# Patient Record
Sex: Female | Born: 1935 | Race: White | Hispanic: No | Smoking: Never smoker
Health system: Southern US, Community
[De-identification: ages and names within clinical notes are randomized; demographics above are authoritative.]

## PROBLEM LIST (undated history)

## (undated) DIAGNOSIS — F0390 Unspecified dementia without behavioral disturbance: Secondary | ICD-10-CM

## (undated) DIAGNOSIS — I10 Essential (primary) hypertension: Secondary | ICD-10-CM

## (undated) DIAGNOSIS — J449 Chronic obstructive pulmonary disease, unspecified: Secondary | ICD-10-CM

## (undated) DIAGNOSIS — G309 Alzheimer's disease, unspecified: Secondary | ICD-10-CM

## (undated) DIAGNOSIS — F0280 Dementia in other diseases classified elsewhere without behavioral disturbance: Secondary | ICD-10-CM

## (undated) DIAGNOSIS — M1990 Unspecified osteoarthritis, unspecified site: Secondary | ICD-10-CM

## (undated) DIAGNOSIS — H9190 Unspecified hearing loss, unspecified ear: Secondary | ICD-10-CM

## (undated) DIAGNOSIS — R3915 Urgency of urination: Secondary | ICD-10-CM

## (undated) HISTORY — PX: CHOLECYSTECTOMY: SHX55

## (undated) HISTORY — PX: ABDOMINAL HYSTERECTOMY: SHX81

---

## 2008-01-29 ENCOUNTER — Emergency Department (HOSPITAL_COMMUNITY): Admission: EM | Admit: 2008-01-29 | Discharge: 2008-01-29 | Payer: Self-pay | Admitting: Emergency Medicine

## 2008-10-24 ENCOUNTER — Inpatient Hospital Stay (HOSPITAL_COMMUNITY): Admission: EM | Admit: 2008-10-24 | Discharge: 2008-10-30 | Payer: Self-pay | Admitting: Emergency Medicine

## 2008-10-25 ENCOUNTER — Ambulatory Visit: Payer: Self-pay | Admitting: Vascular Surgery

## 2008-10-25 ENCOUNTER — Encounter (INDEPENDENT_AMBULATORY_CARE_PROVIDER_SITE_OTHER): Payer: Self-pay | Admitting: Orthopaedic Surgery

## 2008-10-26 ENCOUNTER — Ambulatory Visit: Payer: Self-pay | Admitting: Physical Medicine & Rehabilitation

## 2009-03-26 ENCOUNTER — Encounter: Admission: RE | Admit: 2009-03-26 | Discharge: 2009-06-19 | Payer: Self-pay | Admitting: Orthopaedic Surgery

## 2010-02-25 ENCOUNTER — Emergency Department (HOSPITAL_COMMUNITY): Admission: EM | Admit: 2010-02-25 | Discharge: 2010-02-25 | Payer: Self-pay | Admitting: Emergency Medicine

## 2010-09-08 ENCOUNTER — Ambulatory Visit: Payer: Self-pay | Admitting: Gynecology

## 2011-01-06 LAB — URINE CULTURE: Colony Count: NO GROWTH

## 2011-01-06 LAB — CBC
HCT: 34.5 % — ABNORMAL LOW (ref 36.0–46.0)
MCHC: 34.5 g/dL (ref 30.0–36.0)
Platelets: 112 10*3/uL — ABNORMAL LOW (ref 150–400)
RBC: 3.6 MIL/uL — ABNORMAL LOW (ref 3.87–5.11)
RDW: 13 % (ref 11.5–15.5)

## 2011-01-06 LAB — DIFFERENTIAL
Eosinophils Relative: 3 % (ref 0–5)
Monocytes Absolute: 0.9 10*3/uL (ref 0.1–1.0)
Monocytes Relative: 10 % (ref 3–12)
Neutro Abs: 5.9 10*3/uL (ref 1.7–7.7)

## 2011-01-06 LAB — COMPREHENSIVE METABOLIC PANEL
ALT: 15 U/L (ref 0–35)
AST: 26 U/L (ref 0–37)
Albumin: 3.1 g/dL — ABNORMAL LOW (ref 3.5–5.2)
Alkaline Phosphatase: 52 U/L (ref 39–117)
BUN: 14 mg/dL (ref 6–23)
CO2: 32 mEq/L (ref 19–32)
Chloride: 106 mEq/L (ref 96–112)
Creatinine, Ser: 0.86 mg/dL (ref 0.4–1.2)
GFR calc Af Amer: >60 mL/min (ref >60)
Glucose, Bld: 103 mg/dL — ABNORMAL HIGH (ref 70–99)
Potassium: 3.8 mEq/L (ref 3.5–5.1)
Sodium: 141 mEq/L (ref 135–145)

## 2011-01-06 LAB — URINALYSIS, ROUTINE W REFLEX MICROSCOPIC
Bilirubin Urine: NEGATIVE
Glucose, UA: NEGATIVE mg/dL
Ketones, ur: NEGATIVE mg/dL
Nitrite: NEGATIVE

## 2011-01-06 LAB — TROPONIN I: Troponin I: 0.01 ng/mL (ref 0.00–0.06)

## 2011-01-06 LAB — URINE MICROSCOPIC-ADD ON

## 2011-01-06 LAB — PROTIME-INR: INR: 0.99 (ref 0.00–1.49)

## 2011-02-02 LAB — CBC
HCT: 26.9 % — ABNORMAL LOW (ref 36.0–46.0)
HCT: 30.9 % — ABNORMAL LOW (ref 36.0–46.0)
HCT: 31.4 % — ABNORMAL LOW (ref 36.0–46.0)
HCT: 35.9 % — ABNORMAL LOW (ref 36.0–46.0)
Hemoglobin: 10.6 g/dL — ABNORMAL LOW (ref 12.0–15.0)
Hemoglobin: 10.8 g/dL — ABNORMAL LOW (ref 12.0–15.0)
Hemoglobin: 12 g/dL (ref 12.0–15.0)
Hemoglobin: 9.2 g/dL — ABNORMAL LOW (ref 12.0–15.0)
MCHC: 34.2 g/dL (ref 30.0–36.0)
MCHC: 34.4 g/dL (ref 30.0–36.0)
MCV: 92.1 fL (ref 78.0–100.0)
MCV: 92.9 fL (ref 78.0–100.0)
MCV: 93.6 fL (ref 78.0–100.0)
MCV: 93.7 fL (ref 78.0–100.0)
Platelets: 191 10*3/uL (ref 150–400)
Platelets: 200 10*3/uL (ref 150–400)
Platelets: 208 10*3/uL (ref 150–400)
Platelets: 226 10*3/uL (ref 150–400)
Platelets: 230 10*3/uL (ref 150–400)
RBC: 2.56 MIL/uL — ABNORMAL LOW (ref 3.87–5.11)
RBC: 2.88 MIL/uL — ABNORMAL LOW (ref 3.87–5.11)
RBC: 3.29 MIL/uL — ABNORMAL LOW (ref 3.87–5.11)
RBC: 3.83 MIL/uL — ABNORMAL LOW (ref 3.87–5.11)
RDW: 13.8 % (ref 11.5–15.5)
RDW: 14.1 % (ref 11.5–15.5)
RDW: 14.1 % (ref 11.5–15.5)
WBC: 10.1 10*3/uL (ref 4.0–10.5)
WBC: 11.1 10*3/uL — ABNORMAL HIGH (ref 4.0–10.5)
WBC: 9.6 10*3/uL (ref 4.0–10.5)

## 2011-02-02 LAB — BASIC METABOLIC PANEL
BUN: 13 mg/dL (ref 6–23)
Chloride: 104 mEq/L (ref 96–112)
GFR calc non Af Amer: >60 mL/min (ref >60)
Potassium: 3.6 mEq/L (ref 3.5–5.1)
Sodium: 136 mEq/L (ref 135–145)

## 2011-02-02 LAB — CULTURE, BLOOD (ROUTINE X 2)

## 2011-02-02 LAB — GLUCOSE, CAPILLARY
Glucose-Capillary: 119 mg/dL — ABNORMAL HIGH (ref 70–99)
Glucose-Capillary: 125 mg/dL — ABNORMAL HIGH (ref 70–99)
Glucose-Capillary: 127 mg/dL — ABNORMAL HIGH (ref 70–99)
Glucose-Capillary: 128 mg/dL — ABNORMAL HIGH (ref 70–99)
Glucose-Capillary: 129 mg/dL — ABNORMAL HIGH (ref 70–99)
Glucose-Capillary: 131 mg/dL — ABNORMAL HIGH (ref 70–99)
Glucose-Capillary: 132 mg/dL — ABNORMAL HIGH (ref 70–99)
Glucose-Capillary: 135 mg/dL — ABNORMAL HIGH (ref 70–99)
Glucose-Capillary: 148 mg/dL — ABNORMAL HIGH (ref 70–99)
Glucose-Capillary: 151 mg/dL — ABNORMAL HIGH (ref 70–99)
Glucose-Capillary: 160 mg/dL — ABNORMAL HIGH (ref 70–99)
Glucose-Capillary: 167 mg/dL — ABNORMAL HIGH (ref 70–99)
Glucose-Capillary: 170 mg/dL — ABNORMAL HIGH (ref 70–99)
Glucose-Capillary: 173 mg/dL — ABNORMAL HIGH (ref 70–99)
Glucose-Capillary: 181 mg/dL — ABNORMAL HIGH (ref 70–99)

## 2011-02-02 LAB — PROTIME-INR
INR: 1 (ref 0.00–1.49)
INR: 1 (ref 0.00–1.49)
INR: 1.1 (ref 0.00–1.49)
INR: 1.5 (ref 0.00–1.49)
INR: 1.6 — ABNORMAL HIGH (ref 0.00–1.49)
Prothrombin Time: 13.6 seconds (ref 11.6–15.2)
Prothrombin Time: 14.9 seconds (ref 11.6–15.2)
Prothrombin Time: 18.7 seconds — ABNORMAL HIGH (ref 11.6–15.2)

## 2011-02-02 LAB — URINALYSIS, ROUTINE W REFLEX MICROSCOPIC
Bilirubin Urine: NEGATIVE
Glucose, UA: >1000 mg/dL — AB
Hgb urine dipstick: NEGATIVE
Leukocytes, UA: NEGATIVE
Nitrite: NEGATIVE
Specific Gravity, Urine: 1.02 (ref 1.005–1.030)
Urobilinogen, UA: 0.2 mg/dL (ref 0.0–1.0)

## 2011-02-02 LAB — TYPE AND SCREEN
ABO/RH(D): A POS
Antibody Screen: NEGATIVE

## 2011-02-02 LAB — ABO/RH: ABO/RH(D): A POS

## 2011-02-02 LAB — COMPREHENSIVE METABOLIC PANEL
AST: 25 U/L (ref 0–37)
Albumin: 3.2 g/dL — ABNORMAL LOW (ref 3.5–5.2)
Alkaline Phosphatase: 50 U/L (ref 39–117)
BUN: 11 mg/dL (ref 6–23)
Calcium: 8.9 mg/dL (ref 8.4–10.5)
Creatinine, Ser: 0.74 mg/dL (ref 0.4–1.2)
GFR calc Af Amer: >60 mL/min (ref >60)
Glucose, Bld: 138 mg/dL — ABNORMAL HIGH (ref 70–99)
Sodium: 141 mEq/L (ref 135–145)
Total Protein: 4.9 g/dL — ABNORMAL LOW (ref 6.0–8.3)
Total Protein: 5.8 g/dL — ABNORMAL LOW (ref 6.0–8.3)

## 2011-02-02 LAB — DIFFERENTIAL
Eosinophils Relative: 3 % (ref 0–5)
Lymphocytes Relative: 8 % — ABNORMAL LOW (ref 12–46)
Monocytes Relative: 14 % — ABNORMAL HIGH (ref 3–12)
Neutro Abs: 6.6 10*3/uL (ref 1.7–7.7)
Neutrophils Relative %: 75 % (ref 43–77)

## 2011-02-02 LAB — CK TOTAL AND CKMB (NOT AT ARMC)
CK, MB: 2 ng/mL (ref 0.3–4.0)
Relative Index: INVALID (ref 0.0–2.5)
Total CK: 93 U/L (ref 7–177)

## 2011-02-02 LAB — URINE MICROSCOPIC-ADD ON

## 2011-02-02 LAB — LIPID PANEL
Cholesterol: 161 mg/dL (ref 0–200)
LDL Cholesterol: 110 mg/dL — ABNORMAL HIGH (ref 0–99)
Total CHOL/HDL Ratio: 4.7 RATIO

## 2011-02-02 LAB — HEMOGLOBIN A1C: Hgb A1c MFr Bld: 5.9 % (ref 4.6–6.1)

## 2011-02-02 LAB — D-DIMER, QUANTITATIVE: D-Dimer, Quant: 1.12 ug/mL-FEU — ABNORMAL HIGH (ref 0.00–0.48)

## 2011-02-02 LAB — CARDIAC PANEL(CRET KIN+CKTOT+MB+TROPI): CK, MB: 1.9 ng/mL (ref 0.3–4.0)

## 2011-02-02 LAB — APTT: aPTT: 32 seconds (ref 24–37)

## 2011-02-02 LAB — PREPARE RBC (CROSSMATCH)

## 2011-02-02 LAB — URINE CULTURE: Colony Count: NO GROWTH

## 2011-03-03 NOTE — Discharge Summary (Signed)
NAME:  Hannah Spears, Hannah Spears               ACCOUNT NO.:  [id number]   MEDICAL RECORD NO.:  [id number]          PATIENT TYPE:  INP   LOCATION:  1528                         FACILITY:  [facility]   PHYSICIAN:  Vanita Panda. Magnus Ivan, M.D.DATE OF BIRTH:  1919-12-23   DATE OF ADMISSION:  10/24/2008  DATE OF DISCHARGE:  10/30/2008                               DISCHARGE SUMMARY   ADMITTING DIAGNOSIS:  Left hip femoral neck fracture.   SECONDARY DIAGNOSES:  1. Hypertension.  2. Type 2 diabetes.  3. Arthritis.  4. Chronic obstructive pulmonary disease.   DISCHARGE DIAGNOSIS:  Status post left hip hemiarthroplasty secondary to  left hip femoral neck fracture.   SECONDARY DISCHARGE DIAGNOSES:  1. Hypertension.  2. Type 2 diabetes.  3. Arthritis.  4. Chronic obstructive pulmonary disease.   HOSPITAL COURSE:  Briefly, Hannah Spears is an 77 year old female who lives  alone with family nearby and is a [employer] information [employer] manager.  She fell on  October 15, 2008, which was 9 days prior to admission.  She developed  worsening left hip pain to the point that she had difficulty ambulating.  She was brought by her family to the [facility] Emergency Room and  found to have a left hip displaced femoral neck fracture.  The  orthopedic surgery service and medicine service were consulted.  She had  the potential for syncopal episodes and so studies were performed per  recommendation of the general medical service before clearance for  surgery.  This included an echocardiogram and carotid studies.  She also  had a CT angiogram to rule out a pulmonary embolus.  She was taken to  the operating room on October 25, 2008 after receiving medical clearance  for surgery.  At that point she underwent a left hip hemiarthroplasty  which was an uncomplicated surgery.  For a detailed description of the  operation, please refer to the dictated operative note in the patient's  medical record.  Postoperatively, she convalesced on a  regular hospital  floor and began working slowly with physical therapy and occupational  therapy.  She still needed quite a bit of assistance.  During her  hospitalization she did have acute blood loss anemia and required  transfusion of 2 units of packed red blood cells from which she did  well.  By the day of discharge it was felt that she needed further  convalescence in a skilled nursing facility to rehabilitate her  successfully for hopeful return to the community setting, given her  level of independent function prior to this.  By day of discharge she  was afebrile with stable vital signs.  Her incision was clean, dry, and  intact, and follow-up x-rays showed a well-seated implant.   DISPOSITION:  To skilled nursing facility.   DISCHARGE MEDICATIONS:  1. Nabumetone 500 mg p.o. b.i.d.  2. Evista 60 mg p.o. daily.  3. Terbutaline 5 mg p.o. b.i.d.  4. Potassium chloride 20 mEq p.o. daily.  5. Lasix 40 mg p.o. daily  6. Micardis 40 mg p.o. daily  7. Atrovent at 0.5 mg per 2.5 mL nebulizer q.6 h p.r.n.  8. Zocor 20 mg p.o. daily at 1800 hours  9. Protonix 40 mg p.o. daily  10.Multivitamin p.o. daily  11.Risperdal 0.25 mg p.o. q.h.s.  12.Oxycodone 5 mg p.o. q. 3-4 hours p.r.n.  13.Vicodin 5/500 one to two p.o. q. 4-6 hours p.r.n. pain.  14.Robaxin 500 mg p.o. q. 6 hours p.r.n.   DISCHARGE INSTRUCTIONS:  While at the skilled nursing facility efforts  should be made to ambulate Hannah Spears as much as possible.  Her incision  can get wet in the shower and dry dressing can be applied daily.  She  will work with physical therapy and occupational therapy to hopefully  regain mobility.  A follow-up appointment should be established at  [facility] with Dr. Allie Bossier in 1 week and should be  established with her primary care physician as soon as possible in the  next 1-2 weeks due to diabetes, previously untreated.   *Of note, while she is at skilled nursing facility, she  should continue  on Coumadin dosed daily at 1800 hours for a target INR of 1.9-2.5.  First dose of Coumadin tonight at the skilled nursing facility will be  for 3 mg, with adjustments made accordingly.      Vanita Panda. Magnus Ivan, M.D.  Electronically Signed     CYB/MEDQ  D:  10/30/2008  T:  10/30/2008  Job:  [id number]

## 2011-03-03 NOTE — Consult Note (Signed)
NAME:  Hannah Spears, Hannah Spears               ACCOUNT NO.:  [id number]   MEDICAL RECORD NO.:  [id number]          PATIENT TYPE:  EMS   LOCATION:  ED                           FACILITY:  [facility]   PHYSICIAN:  Herbie Saxon, MDDATE OF BIRTH:  08/21/1920   DATE OF CONSULTATION:  DATE OF DISCHARGE:                                 CONSULTATION   PRIMARY CARE PHYSICIAN:  Lenon Curt. Chilton Si, M.D.   REASON FOR CONSULTATION:  Preop medical clearance.   PRESENTING COMPLAINT:  Fall at home on October 15, 2008.  Left hip  pain, bruising, and swelling, 10 days.   HISTORY OF PRESENTING COMPLAINT:  This 77 year old Caucasian lady was at  home  when she apparently lost her bearing and fell, sustaining a bruise  and pain to the left hip.  Patient, however, has been ambulating with a  walker, but she has noticed increasing severity of the left hip pain and  reduced range of motion with increasing swelling.  The pain was  unbearable last night, and she presented to the emergency room today.  As per the patient, she cannot give a clear-cut history of loss of  consciousness, although she has been feeling lightheaded intermittently  prior to this fall.  Also, she denied a previous episode of fall a few  months prior.  She denies palpitations, chest pain, diaphoresis, or  headaches.  Patient has had poor oral intake with weakness and nausea  since the fall.  She denies any fever, diarrhea, abdominal pain, or  genitourinary symptoms.  No new skin rash or joint swelling.  Patient  has been taken off diuretics since November by her primary care  physician.  Patient claims good compliance with her antihypertensive  medications.  No hematuria, melena, hematemesis, or diarrhea.  Patient  does not have any shortness of breath, wheezing, paroxysmal nocturnal  dyspnea, orthopnea, or pedal or body swelling.   PAST MEDICAL HISTORY:  1. COPD.  2. Arthritis.  3. Hypertension.   FAMILY HISTORY:  Noncontributory.   SOCIAL HISTORY:  Lives at home with family.  No history of alcohol,  tobacco, or drug abuse.   REVIEW OF SYSTEMS:  A 14-point review of systems done with patient.  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   MEDICATIONS:  1. Nabumetone 500 mg b.i.d.  2. Evista 60 mg daily.  3. Terbutaline 5 mg b.i.d.  4. KCL 20 mEq daily.  5. Lasix 40 mg daily.  6. Micardis 40 mg daily.   ALLERGIES:  No known drug allergies.   PHYSICAL EXAMINATION:  She is an elderly lady not in any acute  respiratory distress.  Comfortable.  Lying in bed.  Temperature 98, pulse 86, respiratory rate 18, blood pressure 163/63.  Pupils are equal and reactive to light and accommodation.  She is not  pale or jaundiced.  Hard of hearing.  She has a hearing device at the  left ear.  Visual acuity is good.  There is no strabismus or nystagmus.  Normal visual  fields and visual acuity.  Oropharynx and nasopharynx are  clear.  Mucous membranes are moist.  NECK:  Supple.  There is no elevated JVD or thyromegaly.  No carotid  bruits.  CHEST:  Clinically clear.  No rales or rhonchi.  HEART SOUNDS:  S1 and S2.  Regular rate and rhythm.  No gallops,  murmurs, or rubs.  ABDOMEN:  Soft, nontender.  Bowel sounds are present.  No organomegaly.  Inguinal orifices are patent.  She is alert and oriented to time, place, and person.  Speech is normal.  No facial asymmetry.  Cranial nerves II-XII are intact.  Good range of motion, left hip, on external rotation and shortening of  the left leg.  Peripheral pulses are present.  No pedal edema.  Chronic  discoloration, bilateral one-third, both legs.   LABS:  WBC 8.8, hematocrit 36, platelet count 230.  Chemistry:  Sodium  is 137, potassium 4, chloride 104, bicarbonate 26, BUN 11, creatinine  0.7, glucose 156.  Urinalysis:  WBCs are 3-6, bacteria rare.   CT, head, shows no acute  changes.   EKG:  Normal sinus rhythm at 72 per minute.   Chest x-ray shows no acute changes.   Left hip x-ray shows a left femoral or subcapital fracture.   ASSESSMENT:  1. Fall, query presyncopal episode.  2. Moderate hypertension.  3. Pyuria, rule out mild urinary tract infection.  4. Hyperglycemia, rule out new-onset diabetes.  5. Left hip fracture.  6. History of osteoarthritis.  7. Chronic obstructive pulmonary disease, stable.   RECOMMENDATIONS:  Patient will be cleared for surgery at a moderate-to-  high risk for age and comorbidities.  Consider cardiology evaluation.  Will get a 2D echocardiogram to assist with left ventricular function,  carotid Doppler syncopal workup.  Obtain a urine culture today, blood  culture x2.  Start on Rocephin 1 gm IV daily preop.  Hold DVT  prophylaxis for now.  After procedure, start on Lovenox 40 mg subcu  daily.  Will obtain a D-dimer of fasting lipids, TSH, PT/PTT, INR,  hemoccult, hemoglobin A1C.  Accu-Cheks q.a.c. and nightly with NovoLog  sliding-scale insulin, sensitive-scale coverage.  IV fluid, Ringer's  lactate, at 20 ml/hr, watching for fluid overload.  Get cardiac enzymes  and EKG q.6h. x2.  Start on Protonix 40 mg p.o. daily.  For pain, will  use oxycodone p.r.n. for moderate pain, Tylenol p.r.n. for mild pain,  Dilaudid IV p.r.n. severe pain.  Zofran 2 mg IV q.6h. p.r.n. nausea.  DuoNeb 1 units dose q.6h. p.r.n. shortness of breath.  Oxygen 2-4 liters  nasal cannula p.r.n. to keep sats greater than 90.   Continue home medications, nabumetone, Evista, terbutaline, and reduce  her Lasix to 20 mg daily and KCL 10 mEq daily.  Continue Micardis 40 mg  daily. Lopressor 2.5 mg iv q.6h. p.r.n. blood pressure greater than  160/110.   DIET:  Heart-healthy.   ACTIVITY:  Bedrest.  Consider PT/OT input once stable, 48 hours up to 24-  48 after procedure.   Obtain a CBC and CMP in the morning.   Thanks for this consult, will follow  with you.      Herbie Saxon, MD  Electronically Signed     MIO/MEDQ  D:  10/24/2008  T:  10/24/2008  Job:  [id number]

## 2011-03-03 NOTE — Op Note (Signed)
NAME:  Hannah Spears, Hannah Spears               ACCOUNT NO.:  [id number]   MEDICAL RECORD NO.:  [id number]          PATIENT TYPE:  INP   LOCATION:  1528                         FACILITY:  [facility]   PHYSICIAN:  Vanita Panda. Magnus Ivan, M.D.DATE OF BIRTH:  09-26-1920   DATE OF PROCEDURE:  10/25/2008  DATE OF DISCHARGE:                               OPERATIVE REPORT   PREOPERATIVE DIAGNOSIS:  Left hip displaced femoral neck fracture.   POSTOPERATIVE DIAGNOSIS:  Left hip displaced femoral neck fracture.   PROCEDURE:  1. Left hip hemiarthroplasty.  2. Cable left proximal femur.   COMPONENTS:  DePuy [place] basic Press-Fit stem size 2, 45 mm femoral  unipolar femoral head, -3 tapered spacer.   SURGEON:  Vanita Panda. Magnus Ivan, M.D.   ANESTHESIA:  General.   ANTIBIOTICS:  600 mg IV clindamycin.   BLOOD LOSS:  300 mL.   COMPLICATIONS:  Fractured calcar treated with Dall-Miles cable.   INDICATIONS:  Briefly, Hannah Spears is an 77 year old community ambulator  who lives alone.  Her family lives nearby and is with her quite a bit.  She sustained a mechanical fall on October 15, 2008.  She continued to  have hip pain for over a week and it got to the point where she could  not ambulate.  She presented to the emergency room yesterday with a  shortened and externally rotated left leg.  X-ray showed a displaced  femoral neck fracture.  It was recommended that she undergo a left hip  hemiarthroplasty.  She is admitted to the orthopedic surgery service and  the medicine service has seen her as a consultation who then cleared her  for surgery.  The risks and benefits of this were explained to her and  her family at length.  She was deemed a moderate to high risk surgery  but given her ambulation status they understood the need to proceed with  surgery.   PROCEDURE IN DETAIL:  Informed consent was obtained and the appropriate  left leg was marked and she was brought to the operating room and placed  supine on the operating table.  General anesthesia was then obtained.  She was then turned into a lateral decubitus position with the left hip  operative hip up.  Hip positioners were placed as well as an axillary  roll.  Her left leg and hip were prepped and draped with DuraPrep and  sterile drapes including a sterile stockinette.  Extremity drape was  utilized for leg bags for an anterolateral approach.  A time-out was  called and she was identified as the correct patient and the correct  left hip.  I then made an incision centered directly over the greater  trochanter and carried this proximally and distally.  I dissected the  soft tissues down to the greater trochanter.  There was edema noted  showing this had been an _________ fracture.  I then divided the IT  band, iliotibial band, longitudinally and then proceeded with an  anterior lateral approach to the hip.  A Charnley retractor was placed  and then I proceeded to take down two-thirds of the  gluteus medius and  minimus tendon off the vastus ridge of the greater trochanter.  I  dissected these anteriorly.  The hip capsule was identified and I  divided this in T-type fashion and there was a hematoma encountered.  I  then used an oscillating saw to make a neck cut and I made this neck  decently proximal.  I was then able to remove the head and made a  measurement of the head at approximately 45 mm.  I cleaned the  acetabulum of debris and then trialed a 45 head and this was felt to be  stable.  I then brought the leg off the table with flexion and external  rotation to gain access to the femoral canal.  I used an initiating  reamer to gain access to the femoral canal as well as a femoral canal  finding guide and the lateralizing reamer.  I then started broaching  from a #1 broach up to a #3 broach.  The #3 broach was quite proud and I  used a calcar planer but still this seemed to be well greater than a  fingerbreadth above the  lesser trochanter.  I removed the #3 broach and  then made a more aggressive neck cut.  The #3 broach was still quite  proud so I placed a #2 broach which did not feel loose.  I then trialed  a -3 liner with the size 46 unipolar head and reduced this into the  acetabulum and it was felt to be stable with good range of motion and  her leg lengths were near equal.  I then removed all the trial  components.  I then irrigated the tissues thoroughly and placed the real  [place] basic Press-Fit stem size 2 on my final.  With placing this I did  notice a crack in the calcar.  Using a Dall-Miles cable system I then  placed a single cable around this and the stem itself was still solid  and did not move.  I then placed a -3 sleeve with the 46 unipolar head  and reduced this into the acetabulum and it was felt to be stable.  With  range of motion there was no shuck and the leg lengths again were near  equal.  I then irrigated the tissue, removed the Charnley retractor.  I  reapproximated the gluteus medius and minimus tendon back to the vastus  ridge with a #1 Ethibond suture.  I then closed the IT band with  interrupted format with #1 Vicryl suture.  Of note, I did close the hip  capsule with a #1 Ethibond suture as well.  Once the IT band was closed  I then closed the subcutaneous tissue with interrupted 2-0 Vicryl suture  followed by staples on the skin.  Xeroform followed and a well-padded  sterile dressing was applied.  We then moved the patient into a supine  position and I did feel that she was short on that leg by approximately  0.5 cm.  I did put it through range of motion and it still felt to be  stable.  She was awakened, extubated and taken to the recovery room in  stable condition.  All final counts were correct and blood loss was 300  mL.  Postoperatively we will check x-rays to determine weightbearing  status.      Vanita Panda. Magnus Ivan, M.D.  Electronically Signed      CYB/MEDQ  D:  10/25/2008  T:  10/26/2008  Job:  [id number]

## 2011-07-14 LAB — BASIC METABOLIC PANEL
BUN: 16
Creatinine, Ser: 1.04
GFR calc Af Amer: >60
GFR calc non Af Amer: 50 — ABNORMAL LOW

## 2011-07-14 LAB — RAPID URINE DRUG SCREEN, HOSP PERFORMED
Benzodiazepines: POSITIVE — AB
Cocaine: NOT DETECTED
Tetrahydrocannabinol: NOT DETECTED

## 2011-07-14 LAB — DIFFERENTIAL
Basophils Absolute: 0.1
Basophils Relative: 1
Eosinophils Absolute: 0.1
Eosinophils Relative: 2
Monocytes Absolute: 0.5

## 2011-07-14 LAB — ETHANOL: Alcohol, Ethyl (B): <5

## 2011-07-14 LAB — CBC
RBC: 3.92
WBC: 6.5

## 2011-09-30 ENCOUNTER — Emergency Department (HOSPITAL_COMMUNITY): Payer: Medicare Other

## 2011-09-30 ENCOUNTER — Encounter: Payer: Self-pay | Admitting: *Deleted

## 2011-09-30 ENCOUNTER — Emergency Department (HOSPITAL_COMMUNITY)
Admission: EM | Admit: 2011-09-30 | Discharge: 2011-09-30 | Disposition: A | Discharge status: Skilled Nursing Facility | Payer: Medicare Other | Attending: Emergency Medicine | Admitting: Emergency Medicine

## 2011-09-30 ENCOUNTER — Other Ambulatory Visit: Payer: Self-pay

## 2011-09-30 DIAGNOSIS — W19XXXA Unspecified fall, initial encounter: Secondary | ICD-10-CM | POA: Insufficient documentation

## 2011-09-30 DIAGNOSIS — S0003XA Contusion of scalp, initial encounter: Secondary | ICD-10-CM | POA: Insufficient documentation

## 2011-09-30 DIAGNOSIS — S1093XA Contusion of unspecified part of neck, initial encounter: Secondary | ICD-10-CM | POA: Insufficient documentation

## 2011-09-30 DIAGNOSIS — M25519 Pain in unspecified shoulder: Secondary | ICD-10-CM | POA: Insufficient documentation

## 2011-09-30 DIAGNOSIS — S42009A Fracture of unspecified part of unspecified clavicle, initial encounter for closed fracture: Secondary | ICD-10-CM

## 2011-09-30 DIAGNOSIS — J449 Chronic obstructive pulmonary disease, unspecified: Secondary | ICD-10-CM | POA: Insufficient documentation

## 2011-09-30 DIAGNOSIS — G319 Degenerative disease of nervous system, unspecified: Secondary | ICD-10-CM | POA: Insufficient documentation

## 2011-09-30 DIAGNOSIS — R51 Headache: Secondary | ICD-10-CM | POA: Insufficient documentation

## 2011-09-30 DIAGNOSIS — S42033A Displaced fracture of lateral end of unspecified clavicle, initial encounter for closed fracture: Secondary | ICD-10-CM | POA: Insufficient documentation

## 2011-09-30 DIAGNOSIS — S51009A Unspecified open wound of unspecified elbow, initial encounter: Secondary | ICD-10-CM | POA: Insufficient documentation

## 2011-09-30 DIAGNOSIS — S0181XA Laceration without foreign body of other part of head, initial encounter: Secondary | ICD-10-CM

## 2011-09-30 DIAGNOSIS — M542 Cervicalgia: Secondary | ICD-10-CM | POA: Insufficient documentation

## 2011-09-30 DIAGNOSIS — E079 Disorder of thyroid, unspecified: Secondary | ICD-10-CM | POA: Insufficient documentation

## 2011-09-30 DIAGNOSIS — F0390 Unspecified dementia without behavioral disturbance: Secondary | ICD-10-CM | POA: Insufficient documentation

## 2011-09-30 DIAGNOSIS — J4489 Other specified chronic obstructive pulmonary disease: Secondary | ICD-10-CM | POA: Insufficient documentation

## 2011-09-30 DIAGNOSIS — Y9210 Unspecified residential institution as the place of occurrence of the external cause: Secondary | ICD-10-CM | POA: Insufficient documentation

## 2011-09-30 DIAGNOSIS — E119 Type 2 diabetes mellitus without complications: Secondary | ICD-10-CM | POA: Insufficient documentation

## 2011-09-30 DIAGNOSIS — M1990 Unspecified osteoarthritis, unspecified site: Secondary | ICD-10-CM | POA: Insufficient documentation

## 2011-09-30 DIAGNOSIS — S40019A Contusion of unspecified shoulder, initial encounter: Secondary | ICD-10-CM | POA: Insufficient documentation

## 2011-09-30 DIAGNOSIS — S0180XA Unspecified open wound of other part of head, initial encounter: Secondary | ICD-10-CM | POA: Insufficient documentation

## 2011-09-30 DIAGNOSIS — I10 Essential (primary) hypertension: Secondary | ICD-10-CM | POA: Insufficient documentation

## 2011-09-30 HISTORY — DX: Chronic obstructive pulmonary disease, unspecified: J44.9

## 2011-09-30 HISTORY — DX: Unspecified dementia, unspecified severity, without behavioral disturbance, psychotic disturbance, mood disturbance, and anxiety: F03.90

## 2011-09-30 HISTORY — DX: Unspecified osteoarthritis, unspecified site: M19.90

## 2011-09-30 HISTORY — DX: Urgency of urination: R39.15

## 2011-09-30 HISTORY — DX: Essential (primary) hypertension: I10

## 2011-09-30 MED ORDER — HYDROCODONE-ACETAMINOPHEN 5-325 MG PO TABS: 1.0000 | ORAL_TABLET | Freq: Three times a day (TID) | ORAL | Status: AC | PRN

## 2011-09-30 MED ORDER — HYDROCODONE-ACETAMINOPHEN 5-325 MG PO TABS
1.0000 | ORAL_TABLET | Freq: Once | ORAL | Status: AC
  Administered 2011-09-30: 1 via ORAL
  Filled 2011-09-30: qty 1

## 2011-09-30 NOTE — ED Notes (Signed)
Patient is resting comfortably. 

## 2011-09-30 NOTE — ED Notes (Signed)
Informed patient and/or family of status. Awaiting xray & CT scan. Pt confused & asking repetitive questions. Reoriented to place, time & circumstances. No active bleeding presently.

## 2011-09-30 NOTE — ED Provider Notes (Signed)
Medical screening examination/treatment/procedure(s) were conducted as a shared visit with non-physician practitioner(s) and myself.  I personally evaluated the patient during the encounter  Demented patient with fall.  Laceration to temple.  Ecchymosis to R shoulder. Skin tear to R elbow.  NVI.    Glynn Octave, MD 09/30/11 [phone]

## 2011-09-30 NOTE — ED Notes (Signed)
Pt brought from [facility] for unwitnessed fall.  Nursing home staff picked her up off the floor and placed her in a wheelchair.  Swelling to R shoulder, skin tear to R elbow, small lac to outer eye, hematoma to R temple.  Pt not on coumadin.  Pt alert to her norm (hx of dementia).

## 2011-09-30 NOTE — Progress Notes (Signed)
Orthopedic Tech Progress Note Patient Details:  Hannah Spears May 29, 1920 [id number] Applied sling to right arm     Gaye Pollack 09/30/2011, 12:05 PM

## 2011-09-30 NOTE — ED Notes (Signed)
Skin tear noted to right elbow as ortho tech at bedside.  Hermelinda Medicus, PA notified.  Wound dressed with tegaderm.

## 2011-09-30 NOTE — ED Notes (Signed)
Patient transported to CT 

## 2011-09-30 NOTE — ED Notes (Signed)
Ortho paged for immobilizer sling at this time.

## 2011-09-30 NOTE — ED Notes (Signed)
Returned from radiology. Phillie collar remains in place

## 2011-09-30 NOTE — ED Notes (Signed)
Hermelinda Medicus, PA at pt bedside at this time.

## 2011-09-30 NOTE — ED Notes (Signed)
Dermabond at pt bedside.  Barcode unreadable by scanner.  Pt wound cleansed over right eye.

## 2011-09-30 NOTE — ED Notes (Signed)
Report received, assumed care.  

## 2011-09-30 NOTE — ED Notes (Signed)
Status unchanged. NAD. SR up x 2

## 2011-09-30 NOTE — ED Notes (Signed)
Radial and ulnar pulses bil.  Pt c/o pain to R shoulder.  Cervical pain.   C-collar to be placed.

## 2011-09-30 NOTE — ED Notes (Signed)
Pt with [facility] at this time for transport back to [facility].

## 2011-09-30 NOTE — ED Notes (Signed)
Pt also c/o L hip pain.  L leg approx 1" shorter than R. However, old scar to L hip joint.  Pt able to lift L leg with no increased pain.  No deformity or discoloration noted.

## 2011-09-30 NOTE — ED Notes (Signed)
Family at bedside. Informed patient and/or family of status  

## 2011-09-30 NOTE — ED Notes (Signed)
Patient transported to X-ray 

## 2011-09-30 NOTE — ED Provider Notes (Signed)
History     CSN: [id number] Arrival date & time: 09/30/2011  6:38 AM   First MD Initiated Contact with Patient 09/30/11 [phone]      Chief Complaint  Patient presents with  . Fall    unknown loc    (Consider location/radiation/quality/duration/timing/severity/associated sxs/prior treatment) Patient is a 77 y.o. female presenting with fall. The history is provided by the patient, the nursing home and medical records. The history is limited by the condition of the patient.  Fall  Pt brought to the emergency department via EMS from [facility] after the patient was found on the floor this morning after a presumed fall.  Pt has moderately severe dementia and is therefore a poor historian.  The fall was not witnessed and the pt does not know what happened.  She c/o headache, R shoulder pain, R side neck pain and L hip pain.  She denies nausea, vomiting, diarrhea, chest pain, shortness of breath and abdominal pain.    Past Medical History  Diagnosis Date  . Dementia   . Hypertension   . Osteoarthritis   . Diabetes mellitus   . COPD (chronic obstructive pulmonary disease)   . Urinary urgency     History reviewed. No pertinent past surgical history.  No family history on file.  History  Substance Use Topics  . Smoking status: Not on file  . Smokeless tobacco: Not on file  . Alcohol Use:     OB History    Grav Para Term Preterm Abortions TAB SAB Ect Mult Living                  Review of Systems All pertinent positives and negatives in the history of present illness Allergies  Lidocaine  Home Medications   Current Outpatient Rx  Name Route Sig Dispense Refill  . ACETAMINOPHEN 325 MG PO TABS Oral Take 650 mg by mouth every 6 (six) hours as needed. For pain     . CALCARB 600/D PO Oral Take 1 tablet by mouth 2 (two) times daily.      Marland Kitchen VITAMIN D 1000 UNITS PO TABS Oral Take 1,000 Units by mouth daily.      Marland Kitchen CRANBERRY 475 MG PO CAPS Oral Take 1 capsule by  mouth 2 (two) times daily.      . FUROSEMIDE 40 MG PO TABS Oral Take 40 mg by mouth daily.      Marland Kitchen LORAZEPAM 0.5 MG PO TABS Oral Take 0.5 mg by mouth 2 (two) times daily. For anxiety     . MELOXICAM 15 MG PO TABS Oral Take 15 mg by mouth daily.      Marland Kitchen MEMANTINE HCL 10 MG PO TABS Oral Take 10 mg by mouth 2 (two) times daily.      Carma Leaven M PLUS PO TABS Oral Take 1 tablet by mouth daily.      Marland Kitchen POLYETHYLENE GLYCOL 3350 PO PACK Oral Take 17 g by mouth daily.      Marland Kitchen POTASSIUM CHLORIDE 10 MEQ PO TBCR Oral Take 10 mEq by mouth daily.      Marland Kitchen ALIGN PO Oral Take 1 capsule by mouth daily.      Marland Kitchen RALOXIFENE HCL 60 MG PO TABS Oral Take 60 mg by mouth daily.      Marland Kitchen TEMAZEPAM 15 MG PO CAPS Oral Take 15 mg by mouth at bedtime as needed. For insomnia     . TERBUTALINE SULFATE 2.5 MG PO TABS Oral Take 5 mg  by mouth daily.        BP 182/71  Pulse 58  Temp(Src) 97.3 F (36.3 C) (Oral)  Resp 18  SpO2 97%  Physical Exam  Constitutional: She appears well-developed.  HENT:  Head: Normocephalic. Head is with contusion and with laceration. Head is without raccoon's eyes and without Battle's sign.    Right Ear: Tympanic membrane, external ear and ear canal normal.  Left Ear: Tympanic membrane, external ear and ear canal normal.  Nose: Nose normal.  Mouth/Throat: Oropharynx is clear and moist and mucous membranes are normal.  Eyes: Conjunctivae are normal. Pupils are equal, round, and reactive to light.  Neck: Normal range of motion. Neck supple. Muscular tenderness present. No spinous process tenderness present.    Cardiovascular: Normal rate, regular rhythm, normal heart sounds and intact distal pulses.  Exam reveals no gallop and no friction rub.   No murmur heard. Pulmonary/Chest: Effort normal and breath sounds normal. No respiratory distress. She has no wheezes. She has no rales.  Abdominal: Soft. Bowel sounds are normal. She exhibits no distension and no mass. There is no tenderness. There is no  rebound and no guarding.  Musculoskeletal:       Right shoulder: She exhibits decreased range of motion, tenderness, swelling, pain and decreased strength.       Left shoulder: She exhibits normal range of motion, no tenderness, no swelling, no crepitus, no deformity, no pain and normal strength.       Right elbow: She exhibits laceration (skin tear). She exhibits normal range of motion, no swelling and no deformity. tenderness found.       Arms: Neurological: She is alert. She has normal reflexes. No sensory deficit. She exhibits normal muscle tone. Coordination normal. GCS eye subscore is 4. GCS verbal subscore is 5. GCS motor subscore is 6.  Skin: Skin is warm and dry. Abrasion and ecchymosis noted.     Psychiatric: She exhibits abnormal recent memory.    ED Course  Procedures (including critical care time)  Patient has normal x-rays except for the right clavicle injury noted on x-ray.    LACERATION REPAIR Performed by: Carlyle Dolly Authorized by: Carlyle Dolly Consent: Verbal consent obtained. Risks and benefits: risks, benefits and alternatives were discussed Consent given by: patient Patient identity confirmed: provided demographic data Prepped and Draped in normal sterile fashion Wound explored  Laceration Location: R face   Laceration Length: 3.6cm  No Foreign Bodies seen or palpated  Anesthesia: local infiltration  Local anesthetic:none Anesthetic total:   Irrigation method: syringe Amount of cleaning: standard  Skin closure: dermabond  Number of sutures:   Technique: dermabond  Patient tolerance: Patient tolerated the procedure well with no immediate complications.       MDM  Fall with an laceration of the face and right clavicle injury    Date: 09/30/2011  Rate: 60  Rhythm: normal sinus rhythm  QRS Axis: normal  Intervals: normal  ST/T Wave abnormalities: normal  Conduction Disutrbances:first-degree A-V block   Narrative  Interpretation:   Old EKG Reviewed: unchanged      Carlyle Dolly, PA-C 09/30/11 1139  Carlyle Dolly, PA-C 09/30/11 1555

## 2011-09-30 NOTE — ED Notes (Signed)
EKG was done and given to Dr.Rancour. New and Old

## 2013-08-01 ENCOUNTER — Encounter (HOSPITAL_COMMUNITY): Payer: Self-pay | Admitting: Emergency Medicine

## 2013-08-01 ENCOUNTER — Emergency Department (HOSPITAL_COMMUNITY)
Admission: EM | Admit: 2013-08-01 | Discharge: 2013-08-01 | Disposition: A | Discharge status: Home / Self Care | Payer: Medicare Other | Attending: Emergency Medicine | Admitting: Emergency Medicine

## 2013-08-01 ENCOUNTER — Emergency Department (HOSPITAL_COMMUNITY): Payer: Medicare Other

## 2013-08-01 DIAGNOSIS — I10 Essential (primary) hypertension: Secondary | ICD-10-CM | POA: Insufficient documentation

## 2013-08-01 DIAGNOSIS — J4489 Other specified chronic obstructive pulmonary disease: Secondary | ICD-10-CM | POA: Insufficient documentation

## 2013-08-01 DIAGNOSIS — M1990 Unspecified osteoarthritis, unspecified site: Secondary | ICD-10-CM | POA: Insufficient documentation

## 2013-08-01 DIAGNOSIS — Z79899 Other long term (current) drug therapy: Secondary | ICD-10-CM | POA: Insufficient documentation

## 2013-08-01 DIAGNOSIS — J449 Chronic obstructive pulmonary disease, unspecified: Secondary | ICD-10-CM | POA: Insufficient documentation

## 2013-08-01 DIAGNOSIS — F0390 Unspecified dementia without behavioral disturbance: Secondary | ICD-10-CM | POA: Insufficient documentation

## 2013-08-01 DIAGNOSIS — N390 Urinary tract infection, site not specified: Secondary | ICD-10-CM | POA: Insufficient documentation

## 2013-08-01 DIAGNOSIS — E119 Type 2 diabetes mellitus without complications: Secondary | ICD-10-CM | POA: Insufficient documentation

## 2013-08-01 DIAGNOSIS — Z87440 Personal history of urinary (tract) infections: Secondary | ICD-10-CM | POA: Insufficient documentation

## 2013-08-01 LAB — BASIC METABOLIC PANEL
BUN: 27 mg/dL — ABNORMAL HIGH (ref 6–23)
CO2: 29 mEq/L (ref 19–32)
Calcium: 10 mg/dL (ref 8.4–10.5)
Creatinine, Ser: 0.99 mg/dL (ref 0.50–1.10)
GFR calc Af Amer: 56 mL/min — ABNORMAL LOW (ref >90)
GFR calc non Af Amer: 48 mL/min — ABNORMAL LOW (ref >90)
Sodium: 139 mEq/L (ref 135–145)

## 2013-08-01 LAB — URINALYSIS, ROUTINE W REFLEX MICROSCOPIC
Glucose, UA: NEGATIVE mg/dL
Ketones, ur: NEGATIVE mg/dL
Nitrite: NEGATIVE
Urobilinogen, UA: 0.2 mg/dL (ref 0.0–1.0)
pH: 5.5 (ref 5.0–8.0)

## 2013-08-01 LAB — CBC WITH DIFFERENTIAL/PLATELET
Basophils Absolute: 0 10*3/uL (ref 0.0–0.1)
Eosinophils Relative: 0 % (ref 0–5)
Lymphocytes Relative: 5 % — ABNORMAL LOW (ref 12–46)
Monocytes Relative: 18 % — ABNORMAL HIGH (ref 3–12)
Neutrophils Relative %: 77 % (ref 43–77)
Platelets: 106 10*3/uL — ABNORMAL LOW (ref 150–400)
RBC: 3.78 MIL/uL — ABNORMAL LOW (ref 3.87–5.11)
RDW: 13.1 % (ref 11.5–15.5)
WBC: 13.2 10*3/uL — ABNORMAL HIGH (ref 4.0–10.5)

## 2013-08-01 LAB — URINE MICROSCOPIC-ADD ON

## 2013-08-01 MED ORDER — DEXTROSE 5 % IV SOLN
1.0000 g | Freq: Once | INTRAVENOUS | Status: AC
  Administered 2013-08-01: 1 g via INTRAVENOUS
  Filled 2013-08-01: qty 10

## 2013-08-01 MED ORDER — SODIUM CHLORIDE 0.9 % IV BOLUS (SEPSIS)
500.0000 mL | Freq: Once | INTRAVENOUS | Status: AC
  Administered 2013-08-01: 500 mL via INTRAVENOUS

## 2013-08-01 NOTE — ED Notes (Signed)
Pt's daughter, Bluford Kaufmann, is taking Pt's hearing aids home/facility.

## 2013-08-01 NOTE — ED Notes (Signed)
Bed: RU04 Expected date:  Expected time:  Means of arrival:  Comments: Elderly AMS

## 2013-08-01 NOTE — ED Notes (Signed)
[facility] arrived, transporting pt

## 2013-08-01 NOTE — ED Notes (Signed)
[facility] has been called. Waiting for transport back to facility

## 2013-08-01 NOTE — ED Notes (Signed)
[facility] called for pt 

## 2013-08-01 NOTE — ED Provider Notes (Signed)
CSN: [id number]     Arrival date & time 08/01/13  1222 History   First MD Initiated Contact with Patient 08/01/13 1258     Chief Complaint  Patient presents with  . Altered Mental Status   (Consider location/radiation/quality/duration/timing/severity/associated sxs/prior Treatment) HPI.... level V caveat for dementia. Patient has severe dementia and lives in [facility] nursing [facility]. History of frequent urinary tract infections. She has had altered mental status past 12-24 hours. No weakness in arm or leg. No facial asymmetry. Severity is moderate. Nothing makes symptoms better or worse.  Past Medical History  Diagnosis Date  . Dementia   . Hypertension   . Osteoarthritis   . Diabetes mellitus   . COPD (chronic obstructive pulmonary disease)   . Urinary urgency    History reviewed. No pertinent past surgical history. History reviewed. No pertinent family history. History  Substance Use Topics  . Smoking status: Not on file  . Smokeless tobacco: Not on file  . Alcohol Use:    OB History   Grav Para Term Preterm Abortions TAB SAB Ect Mult Living                 Review of Systems  Unable to perform ROS: Dementia    Allergies  Lidocaine  Home Medications   Current Outpatient Rx  Name  Route  Sig  Dispense  Refill  . acetaminophen (TYLENOL) 325 MG tablet   Oral   Take 650 mg by mouth every 6 (six) hours as needed. For pain          . Calcium Carbonate-Vitamin D (CALCARB 600/D PO)   Oral   Take 1 tablet by mouth 2 (two) times daily.           . cholecalciferol (VITAMIN D) 1000 UNITS tablet   Oral   Take 1,000 Units by mouth daily.           . Cranberry 475 MG CAPS   Oral   Take 1 capsule by mouth 2 (two) times daily.           . divalproex (DEPAKOTE SPRINKLE) 125 MG capsule   Oral   Take 125 mg by mouth 2 (two) times daily.         Marland Kitchen escitalopram (LEXAPRO) 20 MG tablet   Oral   Take 20 mg by mouth daily.         . furosemide (LASIX) 40 MG  tablet   Oral   Take 40 mg by mouth daily.           Marland Kitchen LORazepam (ATIVAN) 0.5 MG tablet   Oral   Take 0.5 mg by mouth 2 (two) times daily. For anxiety          . memantine (NAMENDA) 10 MG tablet   Oral   Take 10 mg by mouth 2 (two) times daily.           . Multiple Vitamins-Minerals (MULTIVITAMINS THER. W/MINERALS) TABS   Oral   Take 1 tablet by mouth daily.           . polyethylene glycol (MIRALAX / GLYCOLAX) packet   Oral   Take 17 g by mouth daily.           . potassium chloride (KLOR-CON) 10 MEQ CR tablet   Oral   Take 10 mEq by mouth daily.           . raloxifene (EVISTA) 60 MG tablet   Oral   Take 60 mg by  mouth daily.           . ranitidine (ZANTAC) 150 MG tablet   Oral   Take 75 mg by mouth 2 (two) times daily.         . terbutaline (BRETHINE) 2.5 MG tablet   Oral   Take 5 mg by mouth daily.            BP 115/46  Pulse 64  Temp(Src) 98 F (36.7 C) (Oral)  Resp 18  SpO2 97% Physical Exam  Nursing note and vitals reviewed. Constitutional: She appears well-developed and well-nourished.  HENT:  Head: Normocephalic and atraumatic.  Eyes: Conjunctivae and EOM are normal. Pupils are equal, round, and reactive to light.  Neck: Normal range of motion. Neck supple.  Cardiovascular: Normal rate, regular rhythm and normal heart sounds.   Pulmonary/Chest: Effort normal and breath sounds normal.  Abdominal: Soft. Bowel sounds are normal.  Musculoskeletal: Normal range of motion.  Neurological:  Demented.  Skin: Skin is warm and dry.  Psychiatric:  Demented    ED Course  Procedures (including critical care time) Labs Review Labs Reviewed  URINALYSIS, ROUTINE W REFLEX MICROSCOPIC - Abnormal; Notable for the following:    APPearance CLOUDY (*)    Hgb urine dipstick MODERATE (*)    Protein, ur 30 (*)    Leukocytes, UA LARGE (*)    All other components within normal limits  URINE MICROSCOPIC-ADD ON - Abnormal; Notable for the following:     Casts HYALINE CASTS (*)    All other components within normal limits  BASIC METABOLIC PANEL - Abnormal; Notable for the following:    Glucose, Bld 214 (*)    BUN 27 (*)    GFR calc non Af Amer 48 (*)    GFR calc Af Amer 56 (*)    All other components within normal limits  CBC WITH DIFFERENTIAL - Abnormal; Notable for the following:    WBC 13.2 (*)    RBC 3.78 (*)    Hemoglobin 11.9 (*)    HCT 35.3 (*)    Platelets 106 (*)    Lymphocytes Relative 5 (*)    Monocytes Relative 18 (*)    Neutro Abs 10.1 (*)    Monocytes Absolute 2.4 (*)    All other components within normal limits  URINE CULTURE   Imaging Review Dg Chest Port 1 View  08/01/2013   CLINICAL DATA:  Altered mental status. COPD. Shortness of breath. Hypertension.  EXAM: PORTABLE CHEST - 1 VIEW  COMPARISON:  02/25/2010  FINDINGS: Nonunited right distal clavicular fracture. Mild cardiomegaly. Chronic reticular interstitial accentuation without edema. No pleural effusion identified.  IMPRESSION: 1. Chronic mild interstitial accentuation. 2. Mild cardiomegaly, without overt edema. 3. Nonunited right distal clavicular fracture.   Electronically Signed   By: Herbie Baltimore M.D.   On: 08/01/2013 14:39    EKG Interpretation   None       MDM   1. Urinary tract infection    Urinalysis shows evidence of gross infection. IV Rocephin given.   Discussed with Dr. Timothy Lasso and daughter. Will return to nursing facility     Donnetta Hutching, MD 08/01/13 [phone]

## 2013-08-01 NOTE — ED Notes (Addendum)
Per ems pt is from [facility], DNR. Hx of dementia. Pt had vagal/sycopal episode yesterday on the toilet, was not transported to the hospital. Today pt was laying in bed and more lethargic than normal according to staff. Pt will not respond unless strong pain stimuli applied. Will not respond to nail bed stimuli. Was 90% on room air upon ems arrival. Staff reports she wears O2 continuously 3L. EKG unremarkable. CBG 259  Upon rn assessment pt did open eyes and nod after rn applied strong painful stimuli. Staff reported pt was febrile this morning and administered tylenol. Staff reports pt has sores on her.

## 2013-08-04 LAB — URINE CULTURE

## 2013-12-22 ENCOUNTER — Emergency Department (HOSPITAL_COMMUNITY): Payer: Medicare Other

## 2013-12-22 ENCOUNTER — Emergency Department (HOSPITAL_COMMUNITY)
Admission: EM | Admit: 2013-12-22 | Discharge: 2013-12-23 | Disposition: A | Discharge status: Home / Self Care | Payer: Medicare Other | Attending: Emergency Medicine | Admitting: Emergency Medicine

## 2013-12-22 ENCOUNTER — Encounter (HOSPITAL_COMMUNITY): Payer: Self-pay | Admitting: Emergency Medicine

## 2013-12-22 DIAGNOSIS — F0390 Unspecified dementia without behavioral disturbance: Secondary | ICD-10-CM | POA: Insufficient documentation

## 2013-12-22 DIAGNOSIS — Y9210 Unspecified residential institution as the place of occurrence of the external cause: Secondary | ICD-10-CM | POA: Insufficient documentation

## 2013-12-22 DIAGNOSIS — J449 Chronic obstructive pulmonary disease, unspecified: Secondary | ICD-10-CM | POA: Insufficient documentation

## 2013-12-22 DIAGNOSIS — S022XXA Fracture of nasal bones, initial encounter for closed fracture: Secondary | ICD-10-CM

## 2013-12-22 DIAGNOSIS — IMO0002 Reserved for concepts with insufficient information to code with codable children: Secondary | ICD-10-CM | POA: Insufficient documentation

## 2013-12-22 DIAGNOSIS — I10 Essential (primary) hypertension: Secondary | ICD-10-CM | POA: Insufficient documentation

## 2013-12-22 DIAGNOSIS — S61412A Laceration without foreign body of left hand, initial encounter: Secondary | ICD-10-CM

## 2013-12-22 DIAGNOSIS — W1809XA Striking against other object with subsequent fall, initial encounter: Secondary | ICD-10-CM | POA: Insufficient documentation

## 2013-12-22 DIAGNOSIS — J4489 Other specified chronic obstructive pulmonary disease: Secondary | ICD-10-CM | POA: Insufficient documentation

## 2013-12-22 DIAGNOSIS — Z8739 Personal history of other diseases of the musculoskeletal system and connective tissue: Secondary | ICD-10-CM | POA: Insufficient documentation

## 2013-12-22 DIAGNOSIS — F411 Generalized anxiety disorder: Secondary | ICD-10-CM | POA: Insufficient documentation

## 2013-12-22 DIAGNOSIS — S80212A Abrasion, left knee, initial encounter: Secondary | ICD-10-CM

## 2013-12-22 DIAGNOSIS — S0083XA Contusion of other part of head, initial encounter: Secondary | ICD-10-CM

## 2013-12-22 DIAGNOSIS — W1811XA Fall from or off toilet without subsequent striking against object, initial encounter: Secondary | ICD-10-CM | POA: Insufficient documentation

## 2013-12-22 DIAGNOSIS — Y9389 Activity, other specified: Secondary | ICD-10-CM | POA: Insufficient documentation

## 2013-12-22 DIAGNOSIS — E119 Type 2 diabetes mellitus without complications: Secondary | ICD-10-CM | POA: Insufficient documentation

## 2013-12-22 DIAGNOSIS — S61409A Unspecified open wound of unspecified hand, initial encounter: Secondary | ICD-10-CM | POA: Insufficient documentation

## 2013-12-22 DIAGNOSIS — Z79899 Other long term (current) drug therapy: Secondary | ICD-10-CM | POA: Insufficient documentation

## 2013-12-22 DIAGNOSIS — S0180XA Unspecified open wound of other part of head, initial encounter: Secondary | ICD-10-CM | POA: Insufficient documentation

## 2013-12-22 DIAGNOSIS — S1093XA Contusion of unspecified part of neck, initial encounter: Secondary | ICD-10-CM

## 2013-12-22 DIAGNOSIS — S0003XA Contusion of scalp, initial encounter: Secondary | ICD-10-CM | POA: Insufficient documentation

## 2013-12-22 MED ORDER — HYDROCODONE-ACETAMINOPHEN 5-325 MG PO TABS
1.0000 | ORAL_TABLET | Freq: Once | ORAL | Status: AC
  Administered 2013-12-22: 1 via ORAL
  Filled 2013-12-22: qty 1

## 2013-12-22 NOTE — ED Provider Notes (Addendum)
CSN: [id number]     Arrival date & time 12/22/13  2033 History   First MD Initiated Contact with Patient 12/22/13 2130     Chief Complaint  Patient presents with  . Fall     (Consider location/radiation/quality/duration/timing/severity/associated sxs/prior Treatment) Patient is a 80 y.o. female presenting with fall. The history is provided by the patient.  Fall   patient here after a fall the nursing home where she struck her for head against the bathtub. No loss of consciousness. Complains of pain and swelling to her mid face. Denies any neck pain. She also complains of pain to her left knee and left hand. No abdominal or chest pain. No lower back pain. EMS was called and patient transported here. States that the cause of the incident was that she lost her balance  Past Medical History  Diagnosis Date  . Dementia   . Hypertension   . Osteoarthritis   . Diabetes mellitus   . COPD (chronic obstructive pulmonary disease)   . Urinary urgency    History reviewed. No pertinent past surgical history. No family history on file. History  Substance Use Topics  . Smoking status: Never Smoker   . Smokeless tobacco: Not on file  . Alcohol Use: No   OB History   Grav Para Term Preterm Abortions TAB SAB Ect Mult Living                 Review of Systems  All other systems reviewed and are negative.      Allergies  Lidocaine  Home Medications   Current Outpatient Rx  Name  Route  Sig  Dispense  Refill  . Calcium Carbonate-Vitamin D (CALCARB 600/D PO)   Oral   Take 1 tablet by mouth 2 (two) times daily.           . cholecalciferol (VITAMIN D) 1000 UNITS tablet   Oral   Take 1,000 Units by mouth daily.           . Cranberry 475 MG CAPS   Oral   Take 1 capsule by mouth 2 (two) times daily.           . divalproex (DEPAKOTE SPRINKLE) 125 MG capsule   Oral   Take 125 mg by mouth 2 (two) times daily.         Marland Kitchen. docusate sodium (COLACE) 100 MG capsule   Oral   Take  100 mg by mouth 2 (two) times daily.         Marland Kitchen. escitalopram (LEXAPRO) 20 MG tablet   Oral   Take 20 mg by mouth daily.         . furosemide (LASIX) 40 MG tablet   Oral   Take 40 mg by mouth daily.           Marland Kitchen. LORazepam (ATIVAN) 0.5 MG tablet   Oral   Take 0.5 mg by mouth 2 (two) times daily. For anxiety          . LORazepam (ATIVAN) 0.5 MG tablet   Oral   Take 0.5 mg by mouth every 6 (six) hours as needed for anxiety.         . memantine (NAMENDA) 10 MG tablet   Oral   Take 10 mg by mouth 2 (two) times daily.           . Multiple Vitamins-Minerals (MULTIVITAMINS THER. W/MINERALS) TABS   Oral   Take 1 tablet by mouth daily.           .Marland Kitchen  nitrofurantoin (MACRODANTIN) 100 MG capsule   Oral   Take 100 mg by mouth at bedtime.         . polyethylene glycol (MIRALAX / GLYCOLAX) packet   Oral   Take 17 g by mouth daily.           . potassium chloride (KLOR-CON) 10 MEQ CR tablet   Oral   Take 10 mEq by mouth daily.           . raloxifene (EVISTA) 60 MG tablet   Oral   Take 60 mg by mouth daily.           Marland Kitchen senna-docusate (SENOKOT-S) 8.6-50 MG per tablet   Oral   Take 1 tablet by mouth at bedtime.         . terbutaline (BRETHINE) 2.5 MG tablet   Oral   Take 5 mg by mouth daily.           Marland Kitchen EXPIRED: ranitidine (ZANTAC) 150 MG tablet   Oral   Take 75 mg by mouth 2 (two) times daily.          BP 166/76  Pulse 57  Temp(Src) 98 F (36.7 C) (Oral)  Resp 20  SpO2 98% Physical Exam  Nursing note and vitals reviewed. Constitutional: She appears well-developed and well-nourished.  Non-toxic appearance. No distress.  HENT:  Head: Normocephalic and atraumatic.    Eyes: Conjunctivae, EOM and lids are normal. Pupils are equal, round, and reactive to light.  Neck: Normal range of motion. Neck supple. No spinous process tenderness and no muscular tenderness present. No tracheal deviation present. No mass present.  Cardiovascular: Normal rate,  regular rhythm and normal heart sounds.  Exam reveals no gallop.   No murmur heard. Pulmonary/Chest: Effort normal and breath sounds normal. No stridor. No respiratory distress. She has no decreased breath sounds. She has no wheezes. She has no rhonchi. She has no rales.  Abdominal: Soft. Normal appearance and bowel sounds are normal. She exhibits no distension. There is no tenderness. There is no rebound and no CVA tenderness.  Musculoskeletal: Normal range of motion. She exhibits no edema.       Left hand: She exhibits tenderness and bony tenderness. She exhibits no deformity.       Hands:      Legs: full range of motion at the hips but no shortening or deformity.  Left wrist without deformity and full range of motion.  Neurological: She is alert. No cranial nerve deficit or sensory deficit. GCS eye subscore is 4. GCS verbal subscore is 4. GCS motor subscore is 6.  Skin: Skin is warm and dry. No abrasion and no rash noted.  Psychiatric: Her behavior is normal. Her mood appears anxious. Her speech is delayed.    ED Course  Procedures (including critical care time) Labs Review Labs Reviewed - No data to display Imaging Review No results found.   EKG Interpretation None      MDM   Final diagnoses:  None    Patient given Vicodin here for pain. Hand was splinted by orthopedic tech. She has orthopedist and she will see for this. Wounds on her face were lacerations that are nonsuturable. She'll be given referral to ENT for her nasal bone fractures. Steri-Strips will be applied to her superficial to her superfical facial lacerations   Toy Baker, MD 12/22/13 2359  Toy Baker, MD 12/23/13 0005

## 2013-12-22 NOTE — Progress Notes (Signed)
CSW attempted to engage the patient but at this time she was not medically stable enough to communicate.  The patient appears to be under distress from her injuries and awaiting sutures to the forehead.      Hannah Spears, MSW, [place]CSWA, 12/22/2013,10:07 PM Evening Clinical Social Worker [phone]

## 2013-12-22 NOTE — ED Notes (Signed)
Patient taken to radiology 

## 2013-12-22 NOTE — ED Notes (Addendum)
[facility] Patient was found face down in the bathroom--fell off of commode Patient with hx of dementia--asking where her mother is--which is baseline, per EMS and SNF staff Three small lacs noted to bridge of nose and forehead--bleeding controlled Left index finger with noted deformity--Ice pack in place Left knee with swelling, skin tear and abrasion C-Collar placed by EMS staff

## 2013-12-22 NOTE — ED Notes (Signed)
Patient back from radiology.

## 2013-12-22 NOTE — ED Notes (Signed)
Bed: WA14 Expected date:  Expected time:  Means of arrival:  Comments: EMS 

## 2013-12-22 NOTE — ED Notes (Signed)
Ortho tech at bedside 

## 2013-12-22 NOTE — ED Notes (Signed)
Ortho tech paged  

## 2013-12-23 MED ORDER — HYDROCODONE-ACETAMINOPHEN 5-325 MG PO TABS: 1.0000 | ORAL_TABLET | ORAL | Status: DC | PRN

## 2013-12-23 MED ORDER — HYDROCODONE-ACETAMINOPHEN 5-325 MG PO TABS
1.0000 | ORAL_TABLET | Freq: Once | ORAL | Status: AC
  Administered 2013-12-23: 1 via ORAL
  Filled 2013-12-23: qty 1

## 2013-12-23 NOTE — Discharge Instructions (Signed)
Follow up with dr. Magnus IvanBlackman for your broken hand Nasal Fracture A nasal fracture is a break or crack in the bones of the nose. A minor break usually heals in a month. You often will receive black eyes from a nasal fracture. This is not a cause for concern. The black eyes will go away over 1 to 2 weeks.  DIAGNOSIS  Your caregiver may want to examine you if you are concerned about a fracture of the nose. X-rays of the nose may not show a nasal fracture even when one is present. Sometimes your caregiver must wait 1 to 5 days after the injury to re-check the nose for alignment and to take additional X-rays. Sometimes the caregiver must wait until the swelling has gone down. TREATMENT Minor fractures that have caused no deformity often do not require treatment. More serious fractures where bones are displaced may require surgery. This will take place after the swelling is gone. Surgery will stabilize and align the fracture. HOME CARE INSTRUCTIONS   Put ice on the injured area.  Put ice in a plastic bag.  Place a towel between your skin and the bag.  Leave the ice on for 15-20 minutes, 03-04 times a day.  Take medications as directed by your caregiver.  Only take over-the-counter or prescription medicines for pain, discomfort, or fever as directed by your caregiver.  If your nose starts bleeding, squeeze the soft parts of the nose against the center wall while you are sitting in an upright position for 10 minutes.  Contact sports should be avoided for at least 3 to 4 weeks or as directed by your caregiver. SEEK MEDICAL CARE IF:  Your pain increases or becomes severe.  You continue to have nosebleeds.  The shape of your nose does not return to normal within 5 days.  You have pus draining from the nose. SEEK IMMEDIATE MEDICAL CARE IF:   You have bleeding from your nose that does not stop after 20 minutes of pinching the nostrils closed and keeping ice on the nose.  You have clear fluid  draining from your nose.  You notice a grape-like swelling on the dividing wall between the nostrils (septum). This is a collection of blood (hematoma) that must be drained to help prevent infection.  You have difficulty moving your eyes.  You have recurrent vomiting. Document Released: 10/02/2000 Document Revised: 12/28/2011 Document Reviewed: 01/19/2011 [facility] Patient Information 2014 [place], [place]. Hand Fracture Your caregiver has diagnosed you with a fractured (broken) bone in your hand. If the bones are in good position and the hand is properly immobilized and rested, these injuries will usually heal in 3 to 6 weeks. A cast, splint, or bulky bandage is usually applied to keep the fracture site from moving. Do not remove the splint or cast until your caregiver approves. If the fracture is unstable or the bones are not aligned properly, surgery may be needed. Keep your hand raised (elevated) above the level of your heart as much as possible for the next 2 to 3 days until the swelling and pain are better. Apply ice packs for 15-20 minutes every 3 to 4 hours to help control the pain and swelling. See your caregiver or an orthopedic specialist as directed for follow-up care to make sure the fracture is beginning to heal properly. SEEK IMMEDIATE MEDICAL CARE IF:   You notice your fingers are cold, numb, crooked, or the pain of your injury is severe.  You are not improving or seem to  be getting worse.  You have questions or concerns. Document Released: 11/12/2004 Document Revised: 12/28/2011 Document Reviewed: 04/02/2009 [facility] Patient Information 2014 [place], [place].

## 2013-12-23 NOTE — ED Notes (Signed)
[facility]

## 2013-12-27 ENCOUNTER — Encounter (HOSPITAL_COMMUNITY): Payer: Self-pay | Admitting: Emergency Medicine

## 2013-12-27 ENCOUNTER — Emergency Department (HOSPITAL_COMMUNITY)
Admission: EM | Admit: 2013-12-27 | Discharge: 2013-12-27 | Disposition: A | Discharge status: Home / Self Care | Payer: Medicare Other | Attending: Emergency Medicine | Admitting: Emergency Medicine

## 2013-12-27 ENCOUNTER — Emergency Department (HOSPITAL_COMMUNITY): Payer: Medicare Other

## 2013-12-27 DIAGNOSIS — E119 Type 2 diabetes mellitus without complications: Secondary | ICD-10-CM | POA: Insufficient documentation

## 2013-12-27 DIAGNOSIS — R079 Chest pain, unspecified: Secondary | ICD-10-CM

## 2013-12-27 DIAGNOSIS — I10 Essential (primary) hypertension: Secondary | ICD-10-CM | POA: Insufficient documentation

## 2013-12-27 DIAGNOSIS — J449 Chronic obstructive pulmonary disease, unspecified: Secondary | ICD-10-CM | POA: Diagnosis not present

## 2013-12-27 DIAGNOSIS — F0390 Unspecified dementia without behavioral disturbance: Secondary | ICD-10-CM | POA: Insufficient documentation

## 2013-12-27 DIAGNOSIS — M1990 Unspecified osteoarthritis, unspecified site: Secondary | ICD-10-CM | POA: Insufficient documentation

## 2013-12-27 DIAGNOSIS — M7989 Other specified soft tissue disorders: Secondary | ICD-10-CM

## 2013-12-27 DIAGNOSIS — Z79899 Other long term (current) drug therapy: Secondary | ICD-10-CM | POA: Diagnosis not present

## 2013-12-27 DIAGNOSIS — R0789 Other chest pain: Secondary | ICD-10-CM | POA: Diagnosis not present

## 2013-12-27 DIAGNOSIS — J4489 Other specified chronic obstructive pulmonary disease: Secondary | ICD-10-CM | POA: Insufficient documentation

## 2013-12-27 LAB — COMPREHENSIVE METABOLIC PANEL
ALBUMIN: 3 g/dL — AB (ref 3.5–5.2)
ALK PHOS: 55 U/L (ref 39–117)
ALT: 13 U/L (ref 0–35)
AST: 22 U/L (ref 0–37)
BUN: 17 mg/dL (ref 6–23)
CHLORIDE: 105 meq/L (ref 96–112)
CO2: 27 mEq/L (ref 19–32)
Calcium: 9.7 mg/dL (ref 8.4–10.5)
Creatinine, Ser: 0.73 mg/dL (ref 0.50–1.10)
GFR calc Af Amer: 83 mL/min — ABNORMAL LOW (ref >90)
GFR calc non Af Amer: 72 mL/min — ABNORMAL LOW (ref >90)
Glucose, Bld: 160 mg/dL — ABNORMAL HIGH (ref 70–99)
POTASSIUM: 4 meq/L (ref 3.7–5.3)
Sodium: 144 mEq/L (ref 137–147)
Total Bilirubin: 0.4 mg/dL (ref 0.3–1.2)
Total Protein: 6.2 g/dL (ref 6.0–8.3)

## 2013-12-27 LAB — VALPROIC ACID LEVEL: Valproic Acid Lvl: <10 ug/mL — ABNORMAL LOW (ref 50.0–100.0)

## 2013-12-27 LAB — CBC WITH DIFFERENTIAL/PLATELET
BASOS ABS: 0 10*3/uL (ref 0.0–0.1)
BASOS PCT: 0 % (ref 0–1)
Eosinophils Absolute: 0.2 10*3/uL (ref 0.0–0.7)
Eosinophils Relative: 3 % (ref 0–5)
HCT: 37.2 % (ref 36.0–46.0)
HEMOGLOBIN: 12.6 g/dL (ref 12.0–15.0)
Lymphocytes Relative: 9 % — ABNORMAL LOW (ref 12–46)
Lymphs Abs: 0.7 10*3/uL (ref 0.7–4.0)
MCH: 32.3 pg (ref 26.0–34.0)
MCHC: 33.9 g/dL (ref 30.0–36.0)
MCV: 95.4 fL (ref 78.0–100.0)
MONOS PCT: 12 % (ref 3–12)
Monocytes Absolute: 0.9 10*3/uL (ref 0.1–1.0)
NEUTROS ABS: 5.8 10*3/uL (ref 1.7–7.7)
NEUTROS PCT: 76 % (ref 43–77)
Platelets: 161 10*3/uL (ref 150–400)
RBC: 3.9 MIL/uL (ref 3.87–5.11)
RDW: 13.2 % (ref 11.5–15.5)
WBC: 7.7 10*3/uL (ref 4.0–10.5)

## 2013-12-27 LAB — I-STAT TROPONIN, ED: Troponin i, poc: 0.01 ng/mL (ref 0.00–0.08)

## 2013-12-27 LAB — LIPASE, BLOOD: Lipase: 11 U/L (ref 11–59)

## 2013-12-27 MED ORDER — MORPHINE SULFATE 2 MG/ML IJ SOLN
2.0000 mg | Freq: Once | INTRAMUSCULAR | Status: AC
  Administered 2013-12-27: 2 mg via INTRAVENOUS
  Filled 2013-12-27: qty 1

## 2013-12-27 MED ORDER — MORPHINE SULFATE 2 MG/ML IJ SOLN: 2.0000 mg | Freq: Once | INTRAMUSCULAR | Status: DC

## 2013-12-27 MED ORDER — HYDROCODONE-ACETAMINOPHEN 5-325 MG PO TABS: 1.0000 | ORAL_TABLET | ORAL | Status: DC | PRN

## 2013-12-27 NOTE — ED Notes (Signed)
Notified [facility] for transportation back home 

## 2013-12-27 NOTE — Discharge Instructions (Signed)
Continue taking vicodin 1-2 tabs (crushed up) every 4 hrs for pain.   Follow up with Dr. Jacky KindleAronson.   Return to ER if you have severe pain, vomiting.

## 2013-12-27 NOTE — ED Provider Notes (Signed)
CSN: [id number]     Arrival date & time 12/27/13  1322 History   First MD Initiated Contact with Patient 12/27/13 1323     Chief Complaint  Patient presents with  . Chest Pain     (Consider location/radiation/quality/duration/timing/severity/associated sxs/prior Treatment) The history is provided by the patient.  Hannah NipBertha Rohr is a 80 y.o. female hx of dementia, HTN, DM here with chest pain. Left-sided chest pain radiating to the shoulder this morning. She was given one nitroglycerin and still had pain. She has been falling frequently most recently was 4 days ago. Denies any recent fall since 4 days ago. Her daughter also noticed more swelling in her legs.   Level V caveat- dementia    Past Medical History  Diagnosis Date  . Dementia   . Hypertension   . Osteoarthritis   . Diabetes mellitus   . COPD (chronic obstructive pulmonary disease)   . Urinary urgency    History reviewed. No pertinent past surgical history. No family history on file. History  Substance Use Topics  . Smoking status: Never Smoker   . Smokeless tobacco: Not on file  . Alcohol Use: No   OB History   Grav Para Term Preterm Abortions TAB SAB Ect Mult Living                 Review of Systems  Cardiovascular: Positive for chest pain.  All other systems reviewed and are negative.      Allergies  Lidocaine  Home Medications   Current Outpatient Rx  Name  Route  Sig  Dispense  Refill  . Calcium Carbonate-Vitamin D (CALCARB 600/D PO)   Oral   Take 1 tablet by mouth 2 (two) times daily.           . cholecalciferol (VITAMIN D) 1000 UNITS tablet   Oral   Take 1,000 Units by mouth daily.           . Cranberry 475 MG CAPS   Oral   Take 1 capsule by mouth 2 (two) times daily.           . divalproex (DEPAKOTE SPRINKLE) 125 MG capsule   Oral   Take 125 mg by mouth 2 (two) times daily.         Marland Kitchen. docusate sodium (COLACE) 100 MG capsule   Oral   Take 100 mg by mouth 2 (two) times  daily.         Marland Kitchen. escitalopram (LEXAPRO) 20 MG tablet   Oral   Take 20 mg by mouth daily.         . furosemide (LASIX) 40 MG tablet   Oral   Take 40 mg by mouth daily.           Marland Kitchen. HYDROcodone-acetaminophen (NORCO/VICODIN) 5-325 MG per tablet   Oral   Take 1-2 tablets by mouth every 4 (four) hours as needed.   20 tablet   0   . LORazepam (ATIVAN) 0.5 MG tablet   Oral   Take 0.5 mg by mouth 2 (two) times daily. For anxiety          . LORazepam (ATIVAN) 0.5 MG tablet   Oral   Take 0.5 mg by mouth every 6 (six) hours as needed for anxiety.         . memantine (NAMENDA) 10 MG tablet   Oral   Take 10 mg by mouth 2 (two) times daily.           .Marland Kitchen  Multiple Vitamins-Minerals (MULTIVITAMINS THER. W/MINERALS) TABS   Oral   Take 1 tablet by mouth daily.           . nitrofurantoin (MACRODANTIN) 100 MG capsule   Oral   Take 100 mg by mouth at bedtime.         . polyethylene glycol (MIRALAX / GLYCOLAX) packet   Oral   Take 17 g by mouth daily.           . potassium chloride (KLOR-CON) 10 MEQ CR tablet   Oral   Take 10 mEq by mouth daily.           . raloxifene (EVISTA) 60 MG tablet   Oral   Take 60 mg by mouth daily.           Marland Kitchen EXPIRED: ranitidine (ZANTAC) 150 MG tablet   Oral   Take 75 mg by mouth 2 (two) times daily.         Marland Kitchen senna-docusate (SENOKOT-S) 8.6-50 MG per tablet   Oral   Take 1 tablet by mouth at bedtime.         . terbutaline (BRETHINE) 2.5 MG tablet   Oral   Take 5 mg by mouth daily.            BP 144/67  Pulse 59  Temp(Src) 98.6 F (37 C) (Oral)  Resp 17  SpO2 95% Physical Exam  Nursing note and vitals reviewed. Constitutional:  Demented, chronically ill, uncomfortable   HENT:  Head: Normocephalic.  Mouth/Throat: Oropharynx is clear and moist.  Bruising on R face, steri strips in place   Eyes: Conjunctivae and EOM are normal. Pupils are equal, round, and reactive to light.  Neck: Normal range of motion. Neck  supple.  Cardiovascular: Normal rate, regular rhythm and normal heart sounds.   Pulmonary/Chest: Effort normal and breath sounds normal. No respiratory distress. She has no wheezes. She has no rales.  Abdominal: Soft. Bowel sounds are normal. She exhibits no distension. There is no tenderness. There is no rebound and no guarding.  Musculoskeletal: Normal range of motion.  L arm splint in place. Bilateral leg swelling, L greater than right   Neurological: She is alert.  Demented, moving all extremities.   Skin: Skin is warm and dry.  Psychiatric:  Unable     ED Course  Procedures (including critical care time) Labs Review Labs Reviewed  CBC WITH DIFFERENTIAL - Abnormal; Notable for the following:    Lymphocytes Relative 9 (*)    All other components within normal limits  COMPREHENSIVE METABOLIC PANEL - Abnormal; Notable for the following:    Glucose, Bld 160 (*)    Albumin 3.0 (*)    GFR calc non Af Amer 72 (*)    GFR calc Af Amer 83 (*)    All other components within normal limits  VALPROIC ACID LEVEL - Abnormal; Notable for the following:    Valproic Acid Lvl <10.0 (*)    All other components within normal limits  LIPASE, BLOOD  I-STAT TROPOININ, ED   Imaging Review Dg Chest Portable 1 View  12/27/2013   CLINICAL DATA Mid chest pain radiating to left shoulder this morning, history hypertension, diabetes, COPD  EXAM PORTABLE CHEST - 1 VIEW  COMPARISON Portable exam 1406 hr compared to 12/22/2013  FINDINGS Normal heart size and pulmonary vascularity.  Calcified mildly tortuous thoracic aorta.  Bronchitic changes without infiltrate, pleural effusion or pneumothorax.  Minimal biapical scarring greater on left.  Bones diffusely demineralized.  IMPRESSION Bronchitic changes.  No acute abnormalities.  SIGNATURE  Electronically Signed   By: Ulyses Southward M.D.   On: 12/27/2013 14:37     EKG Interpretation   Date/Time:  Wednesday December 27 2013 13:29:24 EDT Ventricular Rate:  65 PR  Interval:  192 QRS Duration: 71 QT Interval:  408 QTC Calculation: 424 R Axis:   52 Text Interpretation:  Sinus rhythm Borderline low voltage, extremity leads  Baseline wander in lead(s) V1 No significant change since last tracing  Confirmed by Makalynn Berwanger  MD, Charlise Giovanetti ([id number]) on 12/27/2013 1:50:32 PM      MDM   Final diagnoses:  None   Hannah Spears is a 80 y.o. female here with chest pain. She is DNR. No known CAD or stents. I discussed with daughter about goals of care. She said that she wants mother to be comfortable and doesn't want a lot of workup.   3:40 PM Labs at baseline. CXR showed bronchitic changes, no wheezing no exam. Trop neg x 1. US showed no DVT bilaterally. I called Dr. Lorain Childes, who wants patient to go back to nursing home and he will see her there. He doesn't want further workup.      Richardean Canal, MD 12/27/13 [phone]

## 2013-12-27 NOTE — ED Notes (Signed)
Pt returned from vascular

## 2013-12-27 NOTE — Progress Notes (Signed)
VASCULAR LAB PRELIMINARY  PRELIMINARY  PRELIMINARY  PRELIMINARY  Bilateral lower extremity venous duplex  completed.    Preliminary report:  Bilateral:  No evidence of DVT, superficial thrombosis, or Baker's Cyst.    Romie Tay, RVT 12/27/2013, 2:55 PM
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
## 2014-01-02 ENCOUNTER — Ambulatory Visit
Admission: RE | Admit: 2014-01-02 | Discharge: 2014-01-02 | Disposition: A | Discharge status: Home / Self Care | Payer: Medicare Other | Source: Ambulatory Visit | Attending: Internal Medicine | Admitting: Internal Medicine

## 2014-01-02 ENCOUNTER — Other Ambulatory Visit: Payer: Self-pay | Admitting: Internal Medicine

## 2014-01-02 DIAGNOSIS — W19XXXA Unspecified fall, initial encounter: Secondary | ICD-10-CM

## 2014-01-03 ENCOUNTER — Ambulatory Visit
Admission: RE | Admit: 2014-01-03 | Discharge: 2014-01-03 | Disposition: A | Discharge status: Home / Self Care | Payer: No Typology Code available for payment source | Source: Ambulatory Visit | Attending: Internal Medicine | Admitting: Internal Medicine

## 2014-04-16 ENCOUNTER — Emergency Department (HOSPITAL_COMMUNITY): Payer: Medicare Other

## 2014-04-16 ENCOUNTER — Emergency Department (HOSPITAL_COMMUNITY)
Admission: EM | Admit: 2014-04-16 | Discharge: 2014-04-17 | Disposition: A | Discharge status: Skilled Nursing Facility | Payer: Medicare Other | Attending: Emergency Medicine | Admitting: Emergency Medicine

## 2014-04-16 ENCOUNTER — Encounter (HOSPITAL_COMMUNITY): Payer: Self-pay | Admitting: Emergency Medicine

## 2014-04-16 DIAGNOSIS — S0993XA Unspecified injury of face, initial encounter: Secondary | ICD-10-CM | POA: Diagnosis present

## 2014-04-16 DIAGNOSIS — T148XXA Other injury of unspecified body region, initial encounter: Secondary | ICD-10-CM

## 2014-04-16 DIAGNOSIS — J449 Chronic obstructive pulmonary disease, unspecified: Secondary | ICD-10-CM | POA: Insufficient documentation

## 2014-04-16 DIAGNOSIS — S8000XA Contusion of unspecified knee, initial encounter: Secondary | ICD-10-CM | POA: Diagnosis not present

## 2014-04-16 DIAGNOSIS — S0003XA Contusion of scalp, initial encounter: Secondary | ICD-10-CM | POA: Diagnosis not present

## 2014-04-16 DIAGNOSIS — W07XXXA Fall from chair, initial encounter: Secondary | ICD-10-CM | POA: Insufficient documentation

## 2014-04-16 DIAGNOSIS — F0390 Unspecified dementia without behavioral disturbance: Secondary | ICD-10-CM | POA: Diagnosis not present

## 2014-04-16 DIAGNOSIS — Y9289 Other specified places as the place of occurrence of the external cause: Secondary | ICD-10-CM | POA: Diagnosis not present

## 2014-04-16 DIAGNOSIS — S01511A Laceration without foreign body of lip, initial encounter: Secondary | ICD-10-CM

## 2014-04-16 DIAGNOSIS — E119 Type 2 diabetes mellitus without complications: Secondary | ICD-10-CM | POA: Insufficient documentation

## 2014-04-16 DIAGNOSIS — S01501A Unspecified open wound of lip, initial encounter: Secondary | ICD-10-CM | POA: Diagnosis not present

## 2014-04-16 DIAGNOSIS — W19XXXD Unspecified fall, subsequent encounter: Secondary | ICD-10-CM

## 2014-04-16 DIAGNOSIS — Y9389 Activity, other specified: Secondary | ICD-10-CM | POA: Insufficient documentation

## 2014-04-16 DIAGNOSIS — N390 Urinary tract infection, site not specified: Secondary | ICD-10-CM | POA: Insufficient documentation

## 2014-04-16 DIAGNOSIS — Z79899 Other long term (current) drug therapy: Secondary | ICD-10-CM | POA: Diagnosis not present

## 2014-04-16 DIAGNOSIS — Z8739 Personal history of other diseases of the musculoskeletal system and connective tissue: Secondary | ICD-10-CM | POA: Diagnosis not present

## 2014-04-16 DIAGNOSIS — S0083XA Contusion of other part of head, initial encounter: Secondary | ICD-10-CM | POA: Insufficient documentation

## 2014-04-16 DIAGNOSIS — S1093XA Contusion of unspecified part of neck, initial encounter: Secondary | ICD-10-CM

## 2014-04-16 DIAGNOSIS — J4489 Other specified chronic obstructive pulmonary disease: Secondary | ICD-10-CM | POA: Insufficient documentation

## 2014-04-16 DIAGNOSIS — I10 Essential (primary) hypertension: Secondary | ICD-10-CM | POA: Diagnosis not present

## 2014-04-16 DIAGNOSIS — Y92129 Unspecified place in nursing home as the place of occurrence of the external cause: Secondary | ICD-10-CM

## 2014-04-16 DIAGNOSIS — S199XXA Unspecified injury of neck, initial encounter: Secondary | ICD-10-CM | POA: Diagnosis present

## 2014-04-16 LAB — CBC WITH DIFFERENTIAL/PLATELET
BASOS PCT: 0 % (ref 0–1)
Basophils Absolute: 0 10*3/uL (ref 0.0–0.1)
EOS ABS: 0.2 10*3/uL (ref 0.0–0.7)
EOS PCT: 2 % (ref 0–5)
HCT: 33.2 % — ABNORMAL LOW (ref 36.0–46.0)
Hemoglobin: 11.2 g/dL — ABNORMAL LOW (ref 12.0–15.0)
LYMPHS ABS: 0.7 10*3/uL (ref 0.7–4.0)
Lymphocytes Relative: 7 % — ABNORMAL LOW (ref 12–46)
MCH: 30.9 pg (ref 26.0–34.0)
MCHC: 33.7 g/dL (ref 30.0–36.0)
MCV: 91.7 fL (ref 78.0–100.0)
Monocytes Absolute: 1.3 10*3/uL — ABNORMAL HIGH (ref 0.1–1.0)
Monocytes Relative: 13 % — ABNORMAL HIGH (ref 3–12)
NEUTROS PCT: 78 % — AB (ref 43–77)
Neutro Abs: 7.7 10*3/uL (ref 1.7–7.7)
Platelets: 204 10*3/uL (ref 150–400)
RBC: 3.62 MIL/uL — ABNORMAL LOW (ref 3.87–5.11)
RDW: 13.4 % (ref 11.5–15.5)
WBC: 9.9 10*3/uL (ref 4.0–10.5)

## 2014-04-16 LAB — BASIC METABOLIC PANEL
BUN: 12 mg/dL (ref 6–23)
CALCIUM: 9.5 mg/dL (ref 8.4–10.5)
CO2: 25 meq/L (ref 19–32)
Chloride: 104 mEq/L (ref 96–112)
Creatinine, Ser: 0.7 mg/dL (ref 0.50–1.10)
GFR calc Af Amer: 85 mL/min — ABNORMAL LOW (ref >90)
GFR, EST NON AFRICAN AMERICAN: 73 mL/min — AB (ref >90)
Glucose, Bld: 229 mg/dL — ABNORMAL HIGH (ref 70–99)
Potassium: 3.7 mEq/L (ref 3.7–5.3)
SODIUM: 141 meq/L (ref 137–147)

## 2014-04-16 LAB — PROTIME-INR
INR: 1.07 (ref 0.00–1.49)
Prothrombin Time: 13.9 seconds (ref 11.6–15.2)

## 2014-04-16 MED ORDER — LIDOCAINE-EPINEPHRINE-TETRACAINE (LET) SOLUTION: 3.0000 mL | Freq: Once | NASAL | Status: DC
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
## 2014-04-16 NOTE — ED Notes (Signed)
Bed: WA08 Expected date: 04/16/14 Expected time: 9:34 PM Means of arrival: Ambulance Comments: Fall, hip pain

## 2014-04-17 DIAGNOSIS — S01501A Unspecified open wound of lip, initial encounter: Secondary | ICD-10-CM | POA: Diagnosis not present

## 2014-04-17 LAB — URINE MICROSCOPIC-ADD ON

## 2014-04-17 LAB — URINALYSIS, ROUTINE W REFLEX MICROSCOPIC
BILIRUBIN URINE: NEGATIVE
Glucose, UA: NEGATIVE mg/dL
KETONES UR: NEGATIVE mg/dL
NITRITE: POSITIVE — AB
PH: 6.5 (ref 5.0–8.0)
PROTEIN: 30 mg/dL — AB
Specific Gravity, Urine: 1.015 (ref 1.005–1.030)
Urobilinogen, UA: 1 mg/dL (ref 0.0–1.0)

## 2014-04-17 LAB — TYPE AND SCREEN
ABO/RH(D): A POS
Antibody Screen: NEGATIVE

## 2014-04-17 MED ORDER — CEFTRIAXONE SODIUM 1 G IJ SOLR
1.0000 g | Freq: Once | INTRAMUSCULAR | Status: AC
  Administered 2014-04-17: 1 g via INTRAMUSCULAR
  Filled 2014-04-17: qty 10

## 2014-04-17 MED ORDER — CEFIXIME 400 MG PO TABS: 400.0000 mg | ORAL_TABLET | Freq: Every day | ORAL | Status: DC

## 2014-04-17 NOTE — Discharge Instructions (Signed)
Contusion A contusion is a deep bruise. Contusions are the result of an injury that caused bleeding under the skin. The contusion may turn blue, purple, or yellow. Minor injuries will give you a painless contusion, but more severe contusions may stay painful and swollen for a few weeks.  CAUSES  A contusion is usually caused by a blow, trauma, or direct force to an area of the body. SYMPTOMS   Swelling and redness of the injured area.  Bruising of the injured area.  Tenderness and soreness of the injured area.  Pain. DIAGNOSIS  The diagnosis can be made by taking a history and physical exam. An X-ray, CT scan, or MRI may be needed to determine if there were any associated injuries, such as fractures. TREATMENT  Specific treatment will depend on what area of the body was injured. In general, the best treatment for a contusion is resting, icing, elevating, and applying cold compresses to the injured area. Over-the-counter medicines may also be recommended for pain control. Ask your caregiver what the best treatment is for your contusion. HOME CARE INSTRUCTIONS   Put ice on the injured area.  Put ice in a plastic bag.  Place a towel between your skin and the bag.  Leave the ice on for 15-20 minutes, 3-4 times a day, or as directed by your health care provider.  Only take over-the-counter or prescription medicines for pain, discomfort, or fever as directed by your caregiver. Your caregiver may recommend avoiding anti-inflammatory medicines (aspirin, ibuprofen, and naproxen) for 48 hours because these medicines may increase bruising.  Rest the injured area.  If possible, elevate the injured area to reduce swelling. SEEK IMMEDIATE MEDICAL CARE IF:   You have increased bruising or swelling.  You have pain that is getting worse.  Your swelling or pain is not relieved with medicines. MAKE SURE YOU:   Understand these instructions.  Will watch your condition.  Will get help right  away if you are not doing well or get worse. Document Released: 07/15/2005 Document Revised: 10/10/2013 Document Reviewed: 08/10/2011 [facility] Patient Information 2015 [place], [place]. This information is not intended to replace advice given to you by your health care provider. Make sure you discuss any questions you have with your health care provider.  Facial Laceration  A facial laceration is a cut on the face. These injuries can be painful and cause bleeding. Lacerations usually heal quickly, but they need special care to reduce scarring. DIAGNOSIS  Your health care provider will take a medical history, ask for details about how the injury occurred, and examine the wound to determine how deep the cut is. TREATMENT  Some facial lacerations may not require closure. Others may not be able to be closed because of an increased risk of infection. The risk of infection and the chance for successful closure will depend on various factors, including the amount of time since the injury occurred. The wound may be cleaned to help prevent infection. If closure is appropriate, pain medicines may be given if needed. Your health care provider will use stitches (sutures), wound glue (adhesive), or skin adhesive strips to repair the laceration. These tools bring the skin edges together to allow for faster healing and a better cosmetic outcome. If needed, you may also be given a tetanus shot. HOME CARE INSTRUCTIONS  Only take over-the-counter or prescription medicines as directed by your health care provider.  Follow your health care provider's instructions for wound care. These instructions will vary depending on the technique  used for closing the wound. For Sutures:  Keep the wound clean and dry.   If you were given a bandage (dressing), you should change it at least once a day. Also change the dressing if it becomes wet or dirty, or as directed by your health care provider.   Wash the wound with soap and  water 2 times a day. Rinse the wound off with water to remove all soap. Pat the wound dry with a clean towel.   After cleaning, apply a thin layer of the antibiotic ointment recommended by your health care provider. This will help prevent infection and keep the dressing from sticking.   You may shower as usual after the first 24 hours. Do not soak the wound in water until the sutures are removed.   Get your sutures removed as directed by your health care provider. With facial lacerations, sutures should usually be taken out after 4-5 days to avoid stitch marks.   Wait a few days after your sutures are removed before applying any makeup. For Skin Adhesive Strips:  Keep the wound clean and dry.   Do not get the skin adhesive strips wet. You may bathe carefully, using caution to keep the wound dry.   If the wound gets wet, pat it dry with a clean towel.   Skin adhesive strips will fall off on their own. You may trim the strips as the wound heals. Do not remove skin adhesive strips that are still stuck to the wound. They will fall off in time.  For Wound Adhesive:  You may briefly wet your wound in the shower or bath. Do not soak or scrub the wound. Do not swim. Avoid periods of heavy sweating until the skin adhesive has fallen off on its own. After showering or bathing, gently pat the wound dry with a clean towel.   Do not apply liquid medicine, cream medicine, ointment medicine, or makeup to your wound while the skin adhesive is in place. This may loosen the film before your wound is healed.   If a dressing is placed over the wound, be careful not to apply tape directly over the skin adhesive. This may cause the adhesive to be pulled off before the wound is healed.   Avoid prolonged exposure to sunlight or tanning lamps while the skin adhesive is in place.  The skin adhesive will usually remain in place for 5-10 days, then naturally fall off the skin. Do not pick at the adhesive  film.  After Healing: Once the wound has healed, cover the wound with sunscreen during the day for 1 full year. This can help minimize scarring. Exposure to ultraviolet light in the first year will darken the scar. It can take 1-2 years for the scar to lose its redness and to heal completely.  SEEK IMMEDIATE MEDICAL CARE IF:  You have redness, pain, or swelling around the wound.   You see ayellowish-white fluid (pus) coming from the wound.   You have chills or a fever.  MAKE SURE YOU:  Understand these instructions.  Will watch your condition.  Will get help right away if you are not doing well or get worse. Document Released: 11/12/2004 Document Revised: 07/26/2013 Document Reviewed: 05/18/2013 [facility] Patient Information 2015 [place], [place]. This information is not intended to replace advice given to you by your health care provider. Make sure you discuss any questions you have with your health care provider.  Urinary Tract Infection Urinary tract infections (UTIs) can develop anywhere  along your urinary tract. Your urinary tract is your body's drainage system for removing wastes and extra water. Your urinary tract includes two kidneys, two ureters, a bladder, and a urethra. Your kidneys are a pair of bean-shaped organs. Each kidney is about the size of your fist. They are located below your ribs, one on each side of your spine. CAUSES Infections are caused by microbes, which are microscopic organisms, including fungi, viruses, and bacteria. These organisms are so small that they can only be seen through a microscope. Bacteria are the microbes that most commonly cause UTIs. SYMPTOMS  Symptoms of UTIs may vary by age and gender of the patient and by the location of the infection. Symptoms in young women typically include a frequent and intense urge to urinate and a painful, burning feeling in the bladder or urethra during urination. Older women and men are more likely to be tired,  shaky, and weak and have muscle aches and abdominal pain. A fever may mean the infection is in your kidneys. Other symptoms of a kidney infection include pain in your back or sides below the ribs, nausea, and vomiting. DIAGNOSIS To diagnose a UTI, your caregiver will ask you about your symptoms. Your caregiver also will ask to provide a urine sample. The urine sample will be tested for bacteria and white blood cells. White blood cells are made by your body to help fight infection. TREATMENT  Typically, UTIs can be treated with medication. Because most UTIs are caused by a bacterial infection, they usually can be treated with the use of antibiotics. The choice of antibiotic and length of treatment depend on your symptoms and the type of bacteria causing your infection. HOME CARE INSTRUCTIONS  If you were prescribed antibiotics, take them exactly as your caregiver instructs you. Finish the medication even if you feel better after you have only taken some of the medication.  Drink enough water and fluids to keep your urine clear or pale yellow.  Avoid caffeine, tea, and carbonated beverages. They tend to irritate your bladder.  Empty your bladder often. Avoid holding urine for long periods of time.  Empty your bladder before and after sexual intercourse.  After a bowel movement, women should cleanse from front to back. Use each tissue only once. SEEK MEDICAL CARE IF:   You have back pain.  You develop a fever.  Your symptoms do not begin to resolve within 3 days. SEEK IMMEDIATE MEDICAL CARE IF:   You have severe back pain or lower abdominal pain.  You develop chills.  You have nausea or vomiting.  You have continued burning or discomfort with urination. MAKE SURE YOU:   Understand these instructions.  Will watch your condition.  Will get help right away if you are not doing well or get worse. Document Released: 07/15/2005 Document Revised: 04/05/2012 Document Reviewed:  11/13/2011 [facility] Patient Information 2015 [place], [place]. This information is not intended to replace advice given to you by your health care provider. Make sure you discuss any questions you have with your health care provider.

## 2014-04-17 NOTE — ED Notes (Signed)
Patient transported to X-ray 

## 2014-04-17 NOTE — ED Notes (Signed)
Pt is mildly agitated and will not allow staff to assess her vitals. Pt is on the cardiac monitor, heart rate is 92 beats per minute, respirations are 16 per minute and even and unlabored.  Pt has a patent airway, has been very vocal, without much effort.   Will obtain a full set of vitals when Pt allows staff to assess them.

## 2014-04-20 LAB — URINE CULTURE: Colony Count: >=100000

## 2014-04-21 ENCOUNTER — Telehealth (HOSPITAL_BASED_OUTPATIENT_CLINIC_OR_DEPARTMENT_OTHER): Payer: Self-pay | Admitting: Emergency Medicine

## 2014-04-21 NOTE — Telephone Encounter (Signed)
Post ED Visit - Positive Culture Follow-up  Culture report reviewed by antimicrobial stewardship pharmacist: []  Wes Dulaney, Pharm.D., BCPS []  Celedonio MiyamotoJeremy Frens, [address] BoulevardPharm.D., BCPS []  Georgina PillionElizabeth Martin, Pharm.D., BCPS [x]  [place] Pham, [place]arm[place]D., BCPS, AAHIVP []  Estella HuskMichelle Turner, Pharm.D., BCPS, AAHIVP  Positive urine culture Treated with Cefixime, organism sensitive to the same and no further patient follow-up is required at this time.  Marcelle OverlieHolland, Jenel LucksKylie 04/21/2014, 9:55 AM

## 2014-11-19 ENCOUNTER — Ambulatory Visit (INDEPENDENT_AMBULATORY_CARE_PROVIDER_SITE_OTHER): Payer: Medicare Other | Admitting: Podiatry

## 2014-11-19 ENCOUNTER — Encounter: Payer: Self-pay | Admitting: Podiatry

## 2014-11-19 VITALS — BP 139/60 | HR 54 | Resp 11 | Ht 62.0 in | Wt 112.0 lb

## 2014-11-19 DIAGNOSIS — B351 Tinea unguium: Secondary | ICD-10-CM | POA: Diagnosis not present

## 2014-11-19 NOTE — Progress Notes (Signed)
   Subjective:    Patient ID: Hannah Spears, female    DOB: 07/11/1920, 81 y.o.   MRN: [id number]  HPI Comments: N debridement L 10 toenails D and Long-term C elongated, thickened toenails A enclosed shoes T none greater than 6 months  The patient's daughter describes last podiatric visit over year. She is requesting debridement of her mother's toenails   Review of Systems  HENT: Positive for ear pain and hearing loss.   Gastrointestinal: Positive for constipation.  Psychiatric/Behavioral: Positive for confusion.  All other systems reviewed and are negative.      Objective:   Physical Exam  Confused white female non-orientated 3 Patient is able to transfer wheelchair to treatment chair  Vascular: DP pulses 2/4 bilaterally PT pulses 2/4 bilaterally  Neurological: Ankle reflexes equal and reactive bilaterally Sensation to 10 g monofilament wire patient not able to respond Vibratory sensation patient not able to respond  Dermatological: The toenails are extremely elongated up to 1 inch beyond the distal toe, hypertrophic, discolored, and deformed and tender to palpation 6-10  Musculoskeletal: HAV deformities bilaterally Hammertoe second left Patient is nonambulatory       Assessment & Plan:   Assessment: Satisfactory vascular status Neglected symptomatic onychomycoses 6-10  Plan: Debridement of toenails 10 without a bleeding  Recommend periodic debridement at no greater than 6 month intervals

## 2015-05-27 ENCOUNTER — Ambulatory Visit: Payer: Medicaid Other | Admitting: Podiatry

## 2015-05-28 ENCOUNTER — Ambulatory Visit (INDEPENDENT_AMBULATORY_CARE_PROVIDER_SITE_OTHER): Payer: Medicare Other | Admitting: Podiatry

## 2015-05-28 ENCOUNTER — Encounter: Payer: Self-pay | Admitting: Podiatry

## 2015-05-28 DIAGNOSIS — B351 Tinea unguium: Secondary | ICD-10-CM

## 2015-05-28 DIAGNOSIS — M79676 Pain in unspecified toe(s): Secondary | ICD-10-CM | POA: Diagnosis not present

## 2015-05-28 NOTE — Progress Notes (Signed)
Patient ID: Hannah Spears, female   DOB: 23-Apr-1920, 81 y.o.   MRN: [id number]  Subjective: This patient presents today with her caregiver who is requesting toenail debridement. The last visit for this service is 11/19/2014  Objective: Nonresponsive white female presents with caregiver and treatment room The toenails are extremely elongated, incurvated, discolored, hypertrophic and tender to direct palpation 6-10  Assessment: Symptomatic onychomycoses 6-10  Plan: Debridement toenails mechanically and electrically 10 without any bleeding  Reappoint 4 months

## 2015-05-28 NOTE — Patient Instructions (Signed)
They mycotic toenails today were debrided. Return as needed or every 4 months for debridement.

## 2015-06-01 ENCOUNTER — Emergency Department (HOSPITAL_COMMUNITY): Payer: Medicare Other

## 2015-06-01 ENCOUNTER — Encounter (HOSPITAL_COMMUNITY): Payer: Self-pay

## 2015-06-01 ENCOUNTER — Inpatient Hospital Stay (HOSPITAL_COMMUNITY)
Admission: EM | Admit: 2015-06-01 | Discharge: 2015-06-20 | DRG: 291 | Disposition: E | Payer: Medicare Other | Attending: Internal Medicine | Admitting: Internal Medicine

## 2015-06-01 DIAGNOSIS — R0600 Dyspnea, unspecified: Secondary | ICD-10-CM | POA: Diagnosis not present

## 2015-06-01 DIAGNOSIS — J9601 Acute respiratory failure with hypoxia: Secondary | ICD-10-CM | POA: Diagnosis present

## 2015-06-01 DIAGNOSIS — L8990 Pressure ulcer of unspecified site, unspecified stage: Secondary | ICD-10-CM | POA: Insufficient documentation

## 2015-06-01 DIAGNOSIS — I5033 Acute on chronic diastolic (congestive) heart failure: Principal | ICD-10-CM | POA: Diagnosis present

## 2015-06-01 DIAGNOSIS — L89151 Pressure ulcer of sacral region, stage 1: Secondary | ICD-10-CM | POA: Diagnosis present

## 2015-06-01 DIAGNOSIS — F0281 Dementia in other diseases classified elsewhere with behavioral disturbance: Secondary | ICD-10-CM | POA: Diagnosis present

## 2015-06-01 DIAGNOSIS — I10 Essential (primary) hypertension: Secondary | ICD-10-CM | POA: Diagnosis present

## 2015-06-01 DIAGNOSIS — E119 Type 2 diabetes mellitus without complications: Secondary | ICD-10-CM | POA: Diagnosis present

## 2015-06-01 DIAGNOSIS — R1032 Left lower quadrant pain: Secondary | ICD-10-CM | POA: Diagnosis present

## 2015-06-01 DIAGNOSIS — Z79891 Long term (current) use of opiate analgesic: Secondary | ICD-10-CM

## 2015-06-01 DIAGNOSIS — R0902 Hypoxemia: Secondary | ICD-10-CM

## 2015-06-01 DIAGNOSIS — Z79899 Other long term (current) drug therapy: Secondary | ICD-10-CM

## 2015-06-01 DIAGNOSIS — H9190 Unspecified hearing loss, unspecified ear: Secondary | ICD-10-CM | POA: Diagnosis present

## 2015-06-01 DIAGNOSIS — E872 Acidosis, unspecified: Secondary | ICD-10-CM

## 2015-06-01 DIAGNOSIS — F03C0 Unspecified dementia, severe, without behavioral disturbance, psychotic disturbance, mood disturbance, and anxiety: Secondary | ICD-10-CM

## 2015-06-01 DIAGNOSIS — G9340 Encephalopathy, unspecified: Secondary | ICD-10-CM | POA: Diagnosis present

## 2015-06-01 DIAGNOSIS — Z888 Allergy status to other drugs, medicaments and biological substances status: Secondary | ICD-10-CM

## 2015-06-01 DIAGNOSIS — J441 Chronic obstructive pulmonary disease with (acute) exacerbation: Secondary | ICD-10-CM | POA: Diagnosis present

## 2015-06-01 DIAGNOSIS — M1990 Unspecified osteoarthritis, unspecified site: Secondary | ICD-10-CM | POA: Diagnosis present

## 2015-06-01 DIAGNOSIS — Z515 Encounter for palliative care: Secondary | ICD-10-CM

## 2015-06-01 DIAGNOSIS — Z66 Do not resuscitate: Secondary | ICD-10-CM | POA: Diagnosis present

## 2015-06-01 DIAGNOSIS — F0390 Unspecified dementia without behavioral disturbance: Secondary | ICD-10-CM

## 2015-06-01 DIAGNOSIS — J189 Pneumonia, unspecified organism: Secondary | ICD-10-CM | POA: Diagnosis present

## 2015-06-01 DIAGNOSIS — E878 Other disorders of electrolyte and fluid balance, not elsewhere classified: Secondary | ICD-10-CM | POA: Diagnosis present

## 2015-06-01 DIAGNOSIS — K5900 Constipation, unspecified: Secondary | ICD-10-CM | POA: Diagnosis present

## 2015-06-01 DIAGNOSIS — Z22322 Carrier or suspected carrier of Methicillin resistant Staphylococcus aureus: Secondary | ICD-10-CM

## 2015-06-01 DIAGNOSIS — G309 Alzheimer's disease, unspecified: Secondary | ICD-10-CM | POA: Diagnosis present

## 2015-06-01 DIAGNOSIS — R0602 Shortness of breath: Secondary | ICD-10-CM | POA: Insufficient documentation

## 2015-06-01 DIAGNOSIS — E876 Hypokalemia: Secondary | ICD-10-CM | POA: Diagnosis present

## 2015-06-01 DIAGNOSIS — Z791 Long term (current) use of non-steroidal anti-inflammatories (NSAID): Secondary | ICD-10-CM

## 2015-06-01 DIAGNOSIS — D72829 Elevated white blood cell count, unspecified: Secondary | ICD-10-CM | POA: Diagnosis present

## 2015-06-01 HISTORY — DX: Alzheimer's disease, unspecified: G30.9

## 2015-06-01 HISTORY — DX: Dementia in other diseases classified elsewhere, unspecified severity, without behavioral disturbance, psychotic disturbance, mood disturbance, and anxiety: F02.80

## 2015-06-01 HISTORY — DX: Unspecified hearing loss, unspecified ear: H91.90

## 2015-06-01 LAB — CBC WITH DIFFERENTIAL/PLATELET
BASOS ABS: 0 10*3/uL (ref 0.0–0.1)
Basophils Relative: 0 % (ref 0–1)
EOS ABS: 0.1 10*3/uL (ref 0.0–0.7)
Eosinophils Relative: 1 % (ref 0–5)
HCT: 41.7 % (ref 36.0–46.0)
HEMOGLOBIN: 13.8 g/dL (ref 12.0–15.0)
LYMPHS ABS: 0.6 10*3/uL — AB (ref 0.7–4.0)
LYMPHS PCT: 5 % — AB (ref 12–46)
MCH: 31.8 pg (ref 26.0–34.0)
MCHC: 33.1 g/dL (ref 30.0–36.0)
MCV: 96.1 fL (ref 78.0–100.0)
MONOS PCT: 5 % (ref 3–12)
Monocytes Absolute: 0.5 10*3/uL (ref 0.1–1.0)
NEUTROS ABS: 9.2 10*3/uL — AB (ref 1.7–7.7)
Neutrophils Relative %: 89 % — ABNORMAL HIGH (ref 43–77)
Platelets: 195 10*3/uL (ref 150–400)
RBC: 4.34 MIL/uL (ref 3.87–5.11)
RDW: 12.5 % (ref 11.5–15.5)
WBC: 10.4 10*3/uL (ref 4.0–10.5)

## 2015-06-01 MED ORDER — IPRATROPIUM-ALBUTEROL 0.5-2.5 (3) MG/3ML IN SOLN
3.0000 mL | Freq: Once | RESPIRATORY_TRACT | Status: AC
  Administered 2015-06-01: 3 mL via RESPIRATORY_TRACT
  Filled 2015-06-01: qty 3

## 2015-06-01 MED ORDER — FUROSEMIDE 10 MG/ML IJ SOLN
60.0000 mg | Freq: Once | INTRAMUSCULAR | Status: AC
  Administered 2015-06-01: 60 mg via INTRAVENOUS
  Filled 2015-06-01: qty 6

## 2015-06-01 NOTE — ED Provider Notes (Signed)
CSN: [id number]     Arrival date & time 06/16/2015  2139 History   First MD Initiated Contact with Patient 05/23/2015 2154     Chief Complaint  Patient presents with  . Shortness of Breath   HPI  Hannah Spears is a 81yo female arriving via [facility] from [facility]. Per caregiver Mrs. Cage complained of shortness of breath, but little history of issue was reported to EMS.  Was given Lasix 60mg  IM. Was satting 85% on room air and 93% on non-rebreather per EMS. History of COPD noted. Family states she was behaving normally a few days ago. States she seems more sleepy and short of breath today.  Complains of body aches, abdominal pain, and back pain. Family received little history from nursing home, only know that she seemed short of breath and fluid overloaded so she was sent to ED for evaluation. Family would like for her to be comfort care, except for antibiotics, IV medications, oxygen, and suction. Does not wish to be intubated or receive extreme measures of care.  Past Medical History  Diagnosis Date  . Dementia   . Hypertension   . Osteoarthritis   . Diabetes mellitus   . COPD (chronic obstructive pulmonary disease)   . Urinary urgency   . Hearing loss   . Alzheimer's dementia    Past Surgical History  Procedure Laterality Date  . Cholecystectomy    . Abdominal hysterectomy     No family history on file. Social History  Substance Use Topics  . Smoking status: Never Smoker   . Smokeless tobacco: None  . Alcohol Use: No   OB History    No data available     Review of Systems  Constitutional: Negative for fever.  Respiratory: Positive for shortness of breath and wheezing.   Cardiovascular: Positive for leg swelling. Negative for chest pain.  Gastrointestinal: Positive for abdominal pain.  Musculoskeletal: Positive for back pain.    Allergies  Lidocaine  Home Medications   Prior to Admission medications   Medication Sig Start Date End Date Taking? Authorizing Provider   acetaminophen (TYLENOL ARTHRITIS PAIN) 650 MG CR tablet Take 1,300 mg by mouth every 8 (eight) hours.   Yes Historical Provider, MD  Calcium Carbonate-Vitamin D (CALCARB 600/D) 600-400 MG-UNIT per tablet Take 1 tablet by mouth 2 (two) times daily.   Yes Historical Provider, MD  cholecalciferol (VITAMIN D) 1000 UNITS tablet Take 1,000 Units by mouth every morning.    Yes Historical Provider, MD  Cranberry 450 MG TABS Take 450 mg by mouth 2 (two) times daily.   Yes Historical Provider, MD  diphenhydrAMINE (BENADRYL) 25 mg capsule Take 25 mg by mouth every 6 (six) hours as needed for itching or allergies.    Yes Historical Provider, MD  divalproex (DEPAKOTE SPRINKLE) 125 MG capsule Take 125 mg by mouth 3 (three) times daily.    Yes Historical Provider, MD  docusate sodium (COLACE) 100 MG capsule Take 100 mg by mouth 2 (two) times daily.   Yes Historical Provider, MD  escitalopram (LEXAPRO) 20 MG tablet Take 20 mg by mouth every morning.    Yes Historical Provider, MD  furosemide (LASIX) 10 MG/ML solution Take 40 mg by mouth every morning.   Yes Historical Provider, MD  haloperidol (HALDOL) 5 MG tablet Take 5 mg by mouth every evening.    Yes Historical Provider, MD  HYDROcodone-acetaminophen (NORCO/VICODIN) 5-325 MG per tablet Take 1 tablet by mouth every 6 (six) hours as needed for moderate pain.  Yes Historical Provider, MD  LORazepam (ATIVAN) 0.5 MG tablet Take 0.25 mg by mouth 2 (two) times daily as needed for anxiety.   Yes Historical Provider, MD  LORazepam (ATIVAN) 2 MG/ML injection Inject 0.5 mg into the muscle every 8 (eight) hours as needed for anxiety.   Yes Historical Provider, MD  memantine (NAMENDA) 10 MG tablet Take 10 mg by mouth 2 (two) times daily.    Yes Historical Provider, MD  Multiple Vitamin (MULTIVITAMIN WITH MINERALS) TABS tablet Take 1 tablet by mouth every morning.   Yes Historical Provider, MD  polyethylene glycol (MIRALAX / GLYCOLAX) packet Take 17 g by mouth every  morning.    Yes Historical Provider, MD  potassium chloride (MICRO-K) 10 MEQ CR capsule Take 10 mEq by mouth daily.   Yes Historical Provider, MD  raloxifene (EVISTA) 60 MG tablet Take 60 mg by mouth every morning.    Yes Historical Provider, MD  senna-docusate (SENOKOT-S) 8.6-50 MG per tablet Take 1 tablet by mouth at bedtime.   Yes Historical Provider, MD  terbutaline (BRETHINE) 2.5 MG tablet Take 5 mg by mouth every morning.    Yes Historical Provider, MD   BP 136/62 mmHg  Pulse 94  Temp(Src) 98.6 F (37 C) (Oral)  Resp 36  SpO2 91% Physical Exam  Constitutional: She appears well-developed and well-nourished. No distress.  HENT:  Head: Normocephalic and atraumatic.  Eyes: Pupils are equal, round, and reactive to light.  Cardiovascular: Normal rate and regular rhythm.  Exam reveals no gallop and no friction rub.   No murmur heard. Pulmonary/Chest:  Course breath sounds noted bilaterally. On nonrebreather satting 96%, drops to 78% on room air. Not speaking in complete sentences.  Abdominal: Soft. She exhibits distension. There is tenderness.  Diffusely tender abdomen. Hypoactive bowel sounds.  Musculoskeletal:  2+ pitting edema  Neurological:  Able to identify family members. Demented at baseline. Arousable to touch and voice.     ED Course  Procedures (including critical care time) Labs Review Labs Reviewed  CBC WITH DIFFERENTIAL/PLATELET - Abnormal; Notable for the following:    Neutrophils Relative % 89 (*)    Neutro Abs 9.2 (*)    Lymphocytes Relative 5 (*)    Lymphs Abs 0.6 (*)    All other components within normal limits  BASIC METABOLIC PANEL - Abnormal; Notable for the following:    Chloride 99 (*)    Glucose, Bld 348 (*)    GFR calc non Af Amer 50 (*)    GFR calc Af Amer 58 (*)    All other components within normal limits  LACTIC ACID, PLASMA - Abnormal; Notable for the following:    Lactic Acid, Venous 3.1 (*)    All other components within normal limits   URINE CULTURE  CULTURE, BLOOD (ROUTINE X 2)  CULTURE, BLOOD (ROUTINE X 2)  TROPONIN I  BRAIN NATRIURETIC PEPTIDE  LACTIC ACID, PLASMA  URINALYSIS W MICROSCOPIC  I-STAT TROPOININ, ED    Imaging Review Dg Chest Portable 1 View  2015/06/21   CLINICAL DATA:  Shortness of breath.  Chest pain.  EXAM: PORTABLE CHEST - 1 VIEW  COMPARISON:  One-view chest x-ray 12/27/2013.  FINDINGS: Heart size is normal. Atherosclerotic calcifications are again noted in the aortic arch. Mild edema superimposed on chronic interstitial coarsening. No focal airspace consolidation is evident. The visualized soft tissues and bony thorax are unremarkable.  IMPRESSION: 1. Mild edema superimposed on chronic interstitial coarsening. 2. Atherosclerosis.   Electronically Signed   By:  Marin Roberts M.D.   On: 06/08/2015 23:20   I, Garry Heater, personally reviewed and evaluated these images and lab results as part of my medical decision-making.   EKG Interpretation None      MDM   Final diagnoses:  None  Will obtain troponins, BMP, BNP, CBC, and lactic acid. CBC normal. BMP with elevated glucose of 348 and slightly low chloride of 99.Troponin normal. Lactic Acid elevated to 3.1. BNP normal at 72. CXR with edema. Appeared fluid overloaded on exam, order for  IV lasix placed. Duoneb given. Per family, comfort care but agreeable to IV antibiotics, Oxygen, IV medications, and suctioning. DNR and DNI. Hospitalist contacted. Admit given new oxygen requirement and shortness of breath.    [address] [place], [place] 06/02/15 [id number]  Nelva Nay, MD 06/02/15 [phone]

## 2015-06-01 NOTE — ED Notes (Signed)
Resident at the bedside

## 2015-06-01 NOTE — ED Notes (Signed)
Per EMS, Patient is from [facility]. EMS reports caregiver did not give much information stated patient complained of SOB, unknown how long. Patient was given 60 mg of Lasix. Patient has HX DM, Osteoarthritis, Stroke, COPD, Pneumonia, and HTN. 158/92, 84 HR, NSR. Patient was 85% on RA, 93% on NRB.

## 2015-06-02 ENCOUNTER — Inpatient Hospital Stay (HOSPITAL_COMMUNITY): Payer: Medicare Other

## 2015-06-02 ENCOUNTER — Encounter (HOSPITAL_COMMUNITY): Payer: Self-pay | Admitting: Internal Medicine

## 2015-06-02 DIAGNOSIS — G309 Alzheimer's disease, unspecified: Secondary | ICD-10-CM | POA: Diagnosis present

## 2015-06-02 DIAGNOSIS — R0600 Dyspnea, unspecified: Secondary | ICD-10-CM | POA: Diagnosis present

## 2015-06-02 DIAGNOSIS — D72829 Elevated white blood cell count, unspecified: Secondary | ICD-10-CM | POA: Diagnosis not present

## 2015-06-02 DIAGNOSIS — J9601 Acute respiratory failure with hypoxia: Secondary | ICD-10-CM

## 2015-06-02 DIAGNOSIS — R1032 Left lower quadrant pain: Secondary | ICD-10-CM

## 2015-06-02 DIAGNOSIS — Z79899 Other long term (current) drug therapy: Secondary | ICD-10-CM | POA: Diagnosis not present

## 2015-06-02 DIAGNOSIS — G9340 Encephalopathy, unspecified: Secondary | ICD-10-CM | POA: Diagnosis present

## 2015-06-02 DIAGNOSIS — K5900 Constipation, unspecified: Secondary | ICD-10-CM | POA: Diagnosis present

## 2015-06-02 DIAGNOSIS — E872 Acidosis, unspecified: Secondary | ICD-10-CM

## 2015-06-02 DIAGNOSIS — E119 Type 2 diabetes mellitus without complications: Secondary | ICD-10-CM | POA: Diagnosis present

## 2015-06-02 DIAGNOSIS — F0281 Dementia in other diseases classified elsewhere with behavioral disturbance: Secondary | ICD-10-CM | POA: Diagnosis present

## 2015-06-02 DIAGNOSIS — Z66 Do not resuscitate: Secondary | ICD-10-CM | POA: Diagnosis present

## 2015-06-02 DIAGNOSIS — F0390 Unspecified dementia without behavioral disturbance: Secondary | ICD-10-CM | POA: Diagnosis not present

## 2015-06-02 DIAGNOSIS — Z888 Allergy status to other drugs, medicaments and biological substances status: Secondary | ICD-10-CM | POA: Diagnosis not present

## 2015-06-02 DIAGNOSIS — Z22322 Carrier or suspected carrier of Methicillin resistant Staphylococcus aureus: Secondary | ICD-10-CM | POA: Diagnosis not present

## 2015-06-02 DIAGNOSIS — I5033 Acute on chronic diastolic (congestive) heart failure: Secondary | ICD-10-CM | POA: Diagnosis present

## 2015-06-02 DIAGNOSIS — R0902 Hypoxemia: Secondary | ICD-10-CM | POA: Diagnosis not present

## 2015-06-02 DIAGNOSIS — F03C0 Unspecified dementia, severe, without behavioral disturbance, psychotic disturbance, mood disturbance, and anxiety: Secondary | ICD-10-CM

## 2015-06-02 DIAGNOSIS — Z79891 Long term (current) use of opiate analgesic: Secondary | ICD-10-CM | POA: Diagnosis not present

## 2015-06-02 DIAGNOSIS — L8990 Pressure ulcer of unspecified site, unspecified stage: Secondary | ICD-10-CM | POA: Insufficient documentation

## 2015-06-02 DIAGNOSIS — J441 Chronic obstructive pulmonary disease with (acute) exacerbation: Secondary | ICD-10-CM | POA: Diagnosis present

## 2015-06-02 DIAGNOSIS — J189 Pneumonia, unspecified organism: Secondary | ICD-10-CM | POA: Diagnosis present

## 2015-06-02 DIAGNOSIS — Z515 Encounter for palliative care: Secondary | ICD-10-CM | POA: Diagnosis not present

## 2015-06-02 DIAGNOSIS — H9190 Unspecified hearing loss, unspecified ear: Secondary | ICD-10-CM | POA: Diagnosis present

## 2015-06-02 DIAGNOSIS — M1990 Unspecified osteoarthritis, unspecified site: Secondary | ICD-10-CM | POA: Diagnosis present

## 2015-06-02 DIAGNOSIS — L89151 Pressure ulcer of sacral region, stage 1: Secondary | ICD-10-CM | POA: Diagnosis present

## 2015-06-02 DIAGNOSIS — Z791 Long term (current) use of non-steroidal anti-inflammatories (NSAID): Secondary | ICD-10-CM | POA: Diagnosis not present

## 2015-06-02 DIAGNOSIS — I10 Essential (primary) hypertension: Secondary | ICD-10-CM | POA: Diagnosis present

## 2015-06-02 DIAGNOSIS — E878 Other disorders of electrolyte and fluid balance, not elsewhere classified: Secondary | ICD-10-CM | POA: Diagnosis present

## 2015-06-02 DIAGNOSIS — E876 Hypokalemia: Secondary | ICD-10-CM | POA: Diagnosis present

## 2015-06-02 LAB — BASIC METABOLIC PANEL
ANION GAP: 14 (ref 5–15)
ANION GAP: 13 (ref 5–15)
BUN: 17 mg/dL (ref 6–20)
BUN: 17 mg/dL (ref 6–20)
CALCIUM: 9.9 mg/dL (ref 8.9–10.3)
CALCIUM: 9.1 mg/dL (ref 8.9–10.3)
CHLORIDE: 99 mmol/L — AB (ref 101–111)
CO2: 26 mmol/L (ref 22–32)
CO2: 25 mmol/L (ref 22–32)
CREATININE: 0.95 mg/dL (ref 0.44–1.00)
Chloride: 101 mmol/L (ref 101–111)
Creatinine, Ser: 0.99 mg/dL (ref 0.44–1.00)
GFR calc Af Amer: 58 mL/min — ABNORMAL LOW (ref >60)
GFR calc Af Amer: 55 mL/min — ABNORMAL LOW (ref >60)
GFR, EST NON AFRICAN AMERICAN: 50 mL/min — AB (ref >60)
GFR, EST NON AFRICAN AMERICAN: 48 mL/min — AB (ref >60)
Glucose, Bld: 348 mg/dL — ABNORMAL HIGH (ref 65–99)
Glucose, Bld: 329 mg/dL — ABNORMAL HIGH (ref 65–99)
POTASSIUM: 4.1 mmol/L (ref 3.5–5.1)
POTASSIUM: 3.9 mmol/L (ref 3.5–5.1)
SODIUM: 139 mmol/L (ref 135–145)
Sodium: 139 mmol/L (ref 135–145)

## 2015-06-02 LAB — LACTIC ACID, PLASMA: Lactic Acid, Venous: 3.1 mmol/L (ref 0.5–2.0)

## 2015-06-02 LAB — CBC
HCT: 40.1 % (ref 36.0–46.0)
HEMOGLOBIN: 12.9 g/dL (ref 12.0–15.0)
MCH: 31.5 pg (ref 26.0–34.0)
MCHC: 32.2 g/dL (ref 30.0–36.0)
MCV: 97.8 fL (ref 78.0–100.0)
PLATELETS: 146 10*3/uL — AB (ref 150–400)
RBC: 4.1 MIL/uL (ref 3.87–5.11)
RDW: 12.6 % (ref 11.5–15.5)
WBC: 8.3 10*3/uL (ref 4.0–10.5)

## 2015-06-02 LAB — I-STAT TROPONIN, ED: TROPONIN I, POC: 0.01 ng/mL (ref 0.00–0.08)

## 2015-06-02 LAB — TROPONIN I

## 2015-06-02 LAB — MAGNESIUM: MAGNESIUM: 1.4 mg/dL — AB (ref 1.7–2.4)

## 2015-06-02 LAB — GLUCOSE, CAPILLARY: GLUCOSE-CAPILLARY: 296 mg/dL — AB (ref 65–99)

## 2015-06-02 LAB — MRSA PCR SCREENING: MRSA BY PCR: POSITIVE — AB

## 2015-06-02 LAB — BRAIN NATRIURETIC PEPTIDE: B NATRIURETIC PEPTIDE 5: 72 pg/mL (ref 0.0–100.0)

## 2015-06-02 MED ORDER — HYDROCODONE-ACETAMINOPHEN 5-325 MG PO TABS
1.0000 | ORAL_TABLET | Freq: Four times a day (QID) | ORAL | Status: DC | PRN
  Administered 2015-06-02: 1 via ORAL
  Filled 2015-06-02: qty 1

## 2015-06-02 MED ORDER — ONDANSETRON HCL 4 MG/2ML IJ SOLN
4.0000 mg | Freq: Four times a day (QID) | INTRAMUSCULAR | Status: DC | PRN
Start: 2015-06-02 — End: 2015-06-06

## 2015-06-02 MED ORDER — SODIUM CHLORIDE 0.9 % IV BOLUS (SEPSIS)
1000.0000 mL | Freq: Once | INTRAVENOUS | Status: AC
  Administered 2015-06-02: 1000 mL via INTRAVENOUS

## 2015-06-02 MED ORDER — SODIUM CHLORIDE 0.9 % IV BOLUS (SEPSIS): 2000.0000 mL | Freq: Once | INTRAVENOUS | Status: DC

## 2015-06-02 MED ORDER — BUDESONIDE 0.25 MG/2ML IN SUSP
0.2500 mg | Freq: Two times a day (BID) | RESPIRATORY_TRACT | Status: DC
  Administered 2015-06-02 – 2015-06-05 (×8): 0.25 mg via RESPIRATORY_TRACT
  Filled 2015-06-02 (×9): qty 2

## 2015-06-02 MED ORDER — DIVALPROEX SODIUM 125 MG PO CSDR
125.0000 mg | DELAYED_RELEASE_CAPSULE | Freq: Three times a day (TID) | ORAL | Status: DC
  Administered 2015-06-02 – 2015-06-03 (×4): 125 mg via ORAL
  Filled 2015-06-02 (×17): qty 1

## 2015-06-02 MED ORDER — ONDANSETRON HCL 4 MG PO TABS: 4.0000 mg | ORAL_TABLET | Freq: Four times a day (QID) | ORAL | Status: DC | PRN

## 2015-06-02 MED ORDER — POLYETHYLENE GLYCOL 3350 17 G PO PACK
17.0000 g | PACK | Freq: Two times a day (BID) | ORAL | Status: AC
  Administered 2015-06-02: 17 g via ORAL
  Filled 2015-06-02 (×3): qty 1

## 2015-06-02 MED ORDER — MORPHINE SULFATE 2 MG/ML IJ SOLN: 2.0000 mg | INTRAMUSCULAR | Status: DC | PRN

## 2015-06-02 MED ORDER — HALOPERIDOL 5 MG PO TABS
5.0000 mg | ORAL_TABLET | Freq: Every evening | ORAL | Status: DC
  Administered 2015-06-02 – 2015-06-03 (×2): 5 mg via ORAL
  Filled 2015-06-02 (×4): qty 1

## 2015-06-02 MED ORDER — ESCITALOPRAM OXALATE 20 MG PO TABS
20.0000 mg | ORAL_TABLET | Freq: Every morning | ORAL | Status: DC
  Administered 2015-06-02 – 2015-06-04 (×3): 20 mg via ORAL
  Filled 2015-06-02 (×4): qty 1

## 2015-06-02 MED ORDER — POTASSIUM CHLORIDE CRYS ER 20 MEQ PO TBCR
10.0000 meq | EXTENDED_RELEASE_TABLET | Freq: Every day | ORAL | Status: DC
  Administered 2015-06-02 – 2015-06-04 (×3): 10 meq via ORAL
  Filled 2015-06-02 (×3): qty 1

## 2015-06-02 MED ORDER — DIVALPROEX SODIUM 125 MG PO CPSP: 125.0000 mg | ORAL_CAPSULE | Freq: Three times a day (TID) | ORAL | Status: DC

## 2015-06-02 MED ORDER — CETYLPYRIDINIUM CHLORIDE 0.05 % MT LIQD
7.0000 mL | Freq: Two times a day (BID) | OROMUCOSAL | Status: DC
  Administered 2015-06-02 – 2015-06-05 (×5): 7 mL via OROMUCOSAL

## 2015-06-02 MED ORDER — ENOXAPARIN SODIUM 40 MG/0.4ML ~~LOC~~ SOLN
40.0000 mg | SUBCUTANEOUS | Status: DC
  Administered 2015-06-02 – 2015-06-04 (×3): 40 mg via SUBCUTANEOUS
  Filled 2015-06-02 (×3): qty 0.4

## 2015-06-02 MED ORDER — LEVOFLOXACIN IN D5W 750 MG/150ML IV SOLN
750.0000 mg | INTRAVENOUS | Status: DC
  Administered 2015-06-02: 750 mg via INTRAVENOUS
  Filled 2015-06-02: qty 150

## 2015-06-02 MED ORDER — IOHEXOL 300 MG/ML  SOLN
80.0000 mL | Freq: Once | INTRAMUSCULAR | Status: AC | PRN
  Administered 2015-06-02: 80 mL via INTRAVENOUS

## 2015-06-02 MED ORDER — FUROSEMIDE 10 MG/ML IJ SOLN
20.0000 mg | Freq: Two times a day (BID) | INTRAMUSCULAR | Status: DC
  Administered 2015-06-02 – 2015-06-06 (×9): 20 mg via INTRAVENOUS
  Filled 2015-06-02 (×10): qty 2

## 2015-06-02 MED ORDER — FLEET ENEMA 7-19 GM/118ML RE ENEM
1.0000 | ENEMA | Freq: Once | RECTAL | Status: DC | PRN
  Filled 2015-06-02: qty 1

## 2015-06-02 MED ORDER — LORAZEPAM 2 MG/ML IJ SOLN
0.5000 mg | Freq: Three times a day (TID) | INTRAMUSCULAR | Status: DC | PRN
  Administered 2015-06-04: 0.5 mg via INTRAMUSCULAR
  Filled 2015-06-02 (×2): qty 1

## 2015-06-02 MED ORDER — IPRATROPIUM-ALBUTEROL 0.5-2.5 (3) MG/3ML IN SOLN
3.0000 mL | Freq: Four times a day (QID) | RESPIRATORY_TRACT | Status: DC
  Administered 2015-06-02 – 2015-06-03 (×5): 3 mL via RESPIRATORY_TRACT
  Filled 2015-06-02 (×5): qty 3

## 2015-06-02 MED ORDER — MEMANTINE HCL 10 MG PO TABS
10.0000 mg | ORAL_TABLET | Freq: Two times a day (BID) | ORAL | Status: DC
  Administered 2015-06-02 – 2015-06-04 (×3): 10 mg via ORAL
  Filled 2015-06-02 (×10): qty 1

## 2015-06-02 MED ORDER — SODIUM CHLORIDE 0.9 % IV SOLN
INTRAVENOUS | Status: DC
  Administered 2015-06-02: 03:00:00 via INTRAVENOUS

## 2015-06-02 MED ORDER — IOHEXOL 300 MG/ML  SOLN
25.0000 mL | INTRAMUSCULAR | Status: AC
  Administered 2015-06-02 (×2): 25 mL via ORAL

## 2015-06-02 MED ORDER — MORPHINE SULFATE 2 MG/ML IJ SOLN
1.0000 mg | Freq: Four times a day (QID) | INTRAMUSCULAR | Status: DC | PRN
  Administered 2015-06-05: 1 mg via INTRAVENOUS
  Filled 2015-06-02: qty 1

## 2015-06-02 MED ORDER — DOCUSATE SODIUM 100 MG PO CAPS
100.0000 mg | ORAL_CAPSULE | Freq: Two times a day (BID) | ORAL | Status: DC
  Administered 2015-06-02 – 2015-06-04 (×4): 100 mg via ORAL
  Filled 2015-06-02 (×7): qty 1

## 2015-06-02 MED ORDER — CHLORHEXIDINE GLUCONATE 0.12 % MT SOLN
15.0000 mL | Freq: Two times a day (BID) | OROMUCOSAL | Status: DC
  Administered 2015-06-02 – 2015-06-04 (×4): 15 mL via OROMUCOSAL
  Filled 2015-06-02 (×7): qty 15

## 2015-06-02 MED ORDER — SENNOSIDES-DOCUSATE SODIUM 8.6-50 MG PO TABS
1.0000 | ORAL_TABLET | Freq: Every day | ORAL | Status: DC
  Administered 2015-06-02: 1 via ORAL
  Filled 2015-06-02 (×3): qty 1

## 2015-06-02 MED ORDER — RALOXIFENE HCL 60 MG PO TABS
60.0000 mg | ORAL_TABLET | Freq: Every morning | ORAL | Status: DC
  Administered 2015-06-02 – 2015-06-04 (×3): 60 mg via ORAL
  Filled 2015-06-02 (×5): qty 1

## 2015-06-02 NOTE — Progress Notes (Signed)
Patient on nonrebreather with 95% O2 sat, 85% on room air, and 8&% on 4L O2.  MD notified.  Will continue to monitor.

## 2015-06-02 NOTE — ED Notes (Signed)
Attempted in and out cath, pt had large  BM and voided right before, while cleaning up pt voided again, no urine in return in cath.

## 2015-06-02 NOTE — Progress Notes (Signed)
RT came to give Pulmicort treatment and was on a NRB. Pulmicort was administered, but was stopped per O2 desaturation to 84-85%. Patient return back on NRB and O2 sats were 90%. RN notified; RT will continue to monitor patient.

## 2015-06-02 NOTE — Progress Notes (Signed)
Spoke with Angelique Blonder with [facility] via telephone.  Patient placed on Venti mask sting 93%.  Will continue to monitor.

## 2015-06-02 NOTE — H&P (Signed)
[facility] History and Physical  Hannah Spears WUJ:[id number] DOB: Apr 26, 1920 DOA: June 30, 2015  Referring physician: Dr. Caroleen Hamman PCP: Minda Meo, MD   Chief Complaint: Shortness of breath  HPI: Hannah Spears is a 81 y.o. female with past medical history of hypertension who is brought by EMS from [place] the patient cannot provide a history some most of the history is obtained from the chart and from daughter, she relates she was called this afternoon about the patient being short of breath she was given IM Lasix as she was satting 86% on room air and she was transferred to the emergency room. Family states she has not been behaving as she normally does. Today the patient relates she was short of breath, she's been complaining of body aches with abdominal pain and back pain. The relates no fever, cough, diarrhea. The patient using it multiple laxatives and stool softeners to have bowel movements, she uses them on a daily basis.  In the ED: Basic metabolic panel was done that shows hypochloremia, lactic acid of 3.1 BNP of 72 CBC shows a mild white count with a left shift and an x-ray as below so we're consulted for further evaluation.   Review of Systems:  Constitutional:  No weight loss, night sweats, Fevers, chills, fatigue.  HEENT:  No headaches, Difficulty swallowing,Tooth/dental problems,Sore throat,  No sneezing, itching, ear ache, nasal congestion, post nasal drip,  Cardio-vascular:  No chest pain, Orthopnea, PND, swelling in lower extremities, anasarca, dizziness, palpitations  GI:  No heartburn, indigestion,  nausea, vomiting, diarrhea, change in bowel habits, loss of appetite  Resp:  . No excess mucus, no productive cough, No non-productive cough, No coughing up of blood.No change in color of mucus.No wheezing.No chest wall deformity  Skin:  no rash or lesions.  GU:  no dysuria, change in color of urine, no urgency or frequency. No flank pain.  Musculoskeletal:   No joint pain or swelling. No decreased range of motion. No back pain.  Psych:  No change in mood or affect. No depression or anxiety. No memory loss.   Past Medical History  Diagnosis Date  . Dementia   . Hypertension   . Osteoarthritis   . Diabetes mellitus   . COPD (chronic obstructive pulmonary disease)   . Urinary urgency   . Hearing loss   . Alzheimer's dementia    Past Surgical History  Procedure Laterality Date  . Cholecystectomy    . Abdominal hysterectomy     Social History:  reports that she has never smoked. She does not have any smokeless tobacco history on file. She reports that she does not drink alcohol or use illicit drugs.  Allergies  Allergen Reactions  . Lidocaine Other (See Comments)    Per MAR    No family history on file.  Her mother died of a heart attack.  Prior to Admission medications   Medication Sig Start Date End Date Taking? Authorizing Provider  acetaminophen (TYLENOL ARTHRITIS PAIN) 650 MG CR tablet Take 1,300 mg by mouth every 8 (eight) hours.   Yes Historical Provider, MD  Calcium Carbonate-Vitamin D (CALCARB 600/D) 600-400 MG-UNIT per tablet Take 1 tablet by mouth 2 (two) times daily.   Yes Historical Provider, MD  cholecalciferol (VITAMIN D) 1000 UNITS tablet Take 1,000 Units by mouth every morning.    Yes Historical Provider, MD  Cranberry 450 MG TABS Take 450 mg by mouth 2 (two) times daily.   Yes Historical Provider, MD  diphenhydrAMINE (BENADRYL) 25 mg  capsule Take 25 mg by mouth every 6 (six) hours as needed for itching or allergies.    Yes Historical Provider, MD  divalproex (DEPAKOTE SPRINKLE) 125 MG capsule Take 125 mg by mouth 3 (three) times daily.    Yes Historical Provider, MD  docusate sodium (COLACE) 100 MG capsule Take 100 mg by mouth 2 (two) times daily.   Yes Historical Provider, MD  escitalopram (LEXAPRO) 20 MG tablet Take 20 mg by mouth every morning.    Yes Historical Provider, MD  furosemide (LASIX) 10 MG/ML solution  Take 40 mg by mouth every morning.   Yes Historical Provider, MD  haloperidol (HALDOL) 5 MG tablet Take 5 mg by mouth every evening.    Yes Historical Provider, MD  HYDROcodone-acetaminophen (NORCO/VICODIN) 5-325 MG per tablet Take 1 tablet by mouth every 6 (six) hours as needed for moderate pain.   Yes Historical Provider, MD  LORazepam (ATIVAN) 0.5 MG tablet Take 0.25 mg by mouth 2 (two) times daily as needed for anxiety.   Yes Historical Provider, MD  LORazepam (ATIVAN) 2 MG/ML injection Inject 0.5 mg into the muscle every 8 (eight) hours as needed for anxiety.   Yes Historical Provider, MD  memantine (NAMENDA) 10 MG tablet Take 10 mg by mouth 2 (two) times daily.    Yes Historical Provider, MD  Multiple Vitamin (MULTIVITAMIN WITH MINERALS) TABS tablet Take 1 tablet by mouth every morning.   Yes Historical Provider, MD  polyethylene glycol (MIRALAX / GLYCOLAX) packet Take 17 g by mouth every morning.    Yes Historical Provider, MD  potassium chloride (MICRO-K) 10 MEQ CR capsule Take 10 mEq by mouth daily.   Yes Historical Provider, MD  raloxifene (EVISTA) 60 MG tablet Take 60 mg by mouth every morning.    Yes Historical Provider, MD  senna-docusate (SENOKOT-S) 8.6-50 MG per tablet Take 1 tablet by mouth at bedtime.   Yes Historical Provider, MD  terbutaline (BRETHINE) 2.5 MG tablet Take 5 mg by mouth every morning.    Yes Historical Provider, MD   Physical Exam: Filed Vitals:   06/18/15 2300 June 18, 2015 2330 06/02/15 0015 06/02/15 0045  BP: 135/74 136/62 125/62 137/59  Pulse: 97 94 107 108  Temp:      TempSrc:      Resp:   40 36  SpO2: 91% 91% 93% 92%    Wt Readings from Last 3 Encounters:  11/19/14 50.803 kg (112 lb)    General:  Appears calm and comfortable Eyes: PERRL, normal lids, irises & conjunctiva ENT: grossly normal hearing, lips & tongue Neck: no LAD, masses or thyromegaly Cardiovascular: RRR, no m/r/g. No LE edema. Telemetry: SR, no arrhythmias  Respiratory: CTA  bilaterally, no w/r/r. Normal respiratory effort. Abdomen: soft, distended abdomen tender in the left lower quadrant and suprapubic area no rebound or guarding. Skin: no rash or induration seen on limited exam Musculoskeletal: grossly normal tone BUE/BLE Psychiatric: Depressed Neurologic: Moving all 4 extremities without any difficulties.           Labs on Admission:  Basic Metabolic Panel:  Recent Labs Lab 06/18/2015 2327  NA 139  K 4.1  CL 99*  CO2 26  GLUCOSE 348*  BUN 17  CREATININE 0.95  CALCIUM 9.9   Liver Function Tests: No results for input(s): AST, ALT, ALKPHOS, BILITOT, PROT, ALBUMIN in the last 168 hours. No results for input(s): LIPASE, AMYLASE in the last 168 hours. No results for input(s): AMMONIA in the last 168 hours. CBC:  Recent Labs  Lab 2015-06-11 2327  WBC 10.4  NEUTROABS 9.2*  HGB 13.8  HCT 41.7  MCV 96.1  PLT 195   Cardiac Enzymes:  Recent Labs Lab 06-11-2015 2327  TROPONINI <0.03    BNP (last 3 results)  Recent Labs  Jun 11, 2015 2327  BNP 72.0    ProBNP (last 3 results) No results for input(s): PROBNP in the last 8760 hours.  CBG: No results for input(s): GLUCAP in the last 168 hours.  Radiological Exams on Admission: Dg Chest Portable 1 View  06-11-2015   CLINICAL DATA:  Shortness of breath.  Chest pain.  EXAM: PORTABLE CHEST - 1 VIEW  COMPARISON:  One-view chest x-ray 12/27/2013.  FINDINGS: Heart size is normal. Atherosclerotic calcifications are again noted in the aortic arch. Mild edema superimposed on chronic interstitial coarsening. No focal airspace consolidation is evident. The visualized soft tissues and bony thorax are unremarkable.  IMPRESSION: 1. Mild edema superimposed on chronic interstitial coarsening. 2. Atherosclerosis.   Electronically Signed   By: Marin Roberts M.D.   On: 2015/06/11 23:20    EKG: Independently reviewed. none  Assessment/Plan Acute respiratory failure with hypoxia/SIRS/  Leukocytosis Lactic  acid acidosis - Unclear why she is in respiratory failure, concern about an abdominal etiology. She also has an elevated lactic acid but there is no clear source of infection. - Her BNP was 72 her chest x-ray did not show definite infiltrate, I doubt she is fluid overloaded as she has no JVD and no lower extremity edema lungs are clear. - I will give her aggressive IV fluid hydration, and continues IV fluids throughout the night. Place a Foley and monitor strict I's and O's. - I Have discussed with the family and I will not start empiric antibiotics at this time as I'll have source of infection, her blood pressure is stable she is not tachycardic and they have agreed with plan. - In the differential would be abdominal distention that is compromising her respiration she could be contributing to her lactic acidosis.  Abdominal pain, left lower quadrant - I will go ahead and get a CT scan of the abdomen and pelvis. We'll start her on MiraLAX and mag citrate.  Advanced dementia - Continue current home regimen.  Code Status: DNR/DNI DVT Prophylaxis:heparin Family Communication: daughter Disposition Plan: inpatient  Time spent: 65 min  Marinda Elk [facility] Pager [phone]

## 2015-06-02 NOTE — Progress Notes (Signed)
Patient seen and examined. Admitted after midnight secondary to SOB and hypoxia. Patient with significant PMH of COPD, diastolic HF and dementia. CXR suggesting interstitial edema; but not frank fluid overload on physical exam. There is decrease BS at bases and after fluid challenge overnight she has required non-rebreather due to oxygen desaturation. Denies CP. Please referred to H&P written by Dr. Radonna Ricker for further info/details on admission.  Plan: -will d/c IVF's -will start pulmicort and duoneb -will add low dose lasix IV -repeat CXR in am -continue oxygen supplementation -will follow clinical response  Vassie Loll [phone]

## 2015-06-02 NOTE — Progress Notes (Signed)
Pt refusing oral contrast.  Patient did drink one of 2 bottles.  Spoke with Dr. Gwenlyn Perking; OK to do CT of abd/pelvis with IV contrast per [facility] with CT recommendation.

## 2015-06-02 NOTE — Progress Notes (Signed)
Several unsuccessful attempts to insert foley catheter by two RNs. Patients vaginal area swollen and red. Patient wasn't able to tolerate insertion of foley catheter. During insertion, patient voided. MD notified. RN will continue to monitor patient.  Veatrice Kells, RN

## 2015-06-02 NOTE — ED Notes (Signed)
Care Handed to [place], [place]

## 2015-06-02 NOTE — Progress Notes (Signed)
Patient arrived on unit via stretcher with nurse tech. Patient alert to self. Skin assessment completed with Thermon Leyland, RN. Patient presented with excoriation around vaginal area. Barrier cream applied. IV is clean, dry and intact. Patient denies having pain. [name] and granddaughter at the bedside. Orders have been reviewed and implemented. Will continue to monitor the patient. Call light has been placed within reach and bed alarm has been activated.   Rivka Barbara BSN, RN  Phone Number: [phone]

## 2015-06-03 ENCOUNTER — Inpatient Hospital Stay (HOSPITAL_COMMUNITY): Payer: Medicare Other

## 2015-06-03 DIAGNOSIS — J189 Pneumonia, unspecified organism: Secondary | ICD-10-CM

## 2015-06-03 DIAGNOSIS — I5033 Acute on chronic diastolic (congestive) heart failure: Principal | ICD-10-CM

## 2015-06-03 MED ORDER — MAGNESIUM HYDROXIDE 400 MG/5ML PO SUSP
960.0000 mL | Freq: Once | ORAL | Status: AC
  Administered 2015-06-03: 960 mL via RECTAL
  Filled 2015-06-03: qty 240

## 2015-06-03 MED ORDER — CHLORHEXIDINE GLUCONATE CLOTH 2 % EX PADS
6.0000 | MEDICATED_PAD | Freq: Every day | CUTANEOUS | Status: DC
  Administered 2015-06-03 – 2015-06-06 (×3): 6 via TOPICAL

## 2015-06-03 MED ORDER — MUPIROCIN 2 % EX OINT
1.0000 "application " | TOPICAL_OINTMENT | Freq: Two times a day (BID) | CUTANEOUS | Status: DC
  Administered 2015-06-03 – 2015-06-05 (×6): 1 via NASAL
  Filled 2015-06-03 (×2): qty 22

## 2015-06-03 MED ORDER — IPRATROPIUM-ALBUTEROL 0.5-2.5 (3) MG/3ML IN SOLN
3.0000 mL | Freq: Three times a day (TID) | RESPIRATORY_TRACT | Status: DC
  Administered 2015-06-03 – 2015-06-05 (×6): 3 mL via RESPIRATORY_TRACT
  Filled 2015-06-03 (×8): qty 3

## 2015-06-03 MED ORDER — LEVOFLOXACIN 750 MG PO TABS
750.0000 mg | ORAL_TABLET | ORAL | Status: DC
  Filled 2015-06-03: qty 1

## 2015-06-03 NOTE — Progress Notes (Addendum)
[facility] PROGRESS NOTE  Hannah Spears ZOX:[id number] DOB: January 09, 1920 DOA: 06-11-15 PCP: Minda Meo, MD  Assessment/Plan: 1-acute respiratory failure with hypoxia: Appears to be secondary to interstitial edema and community-acquired pneumonia -Patient will be treated with IV Lasix, antibiotics, nebulizer therapy, mucolytic's and oxygen supplementation  -Will provide supportive care and adjust therapy as needed.  2-positive MRSA: Patient is on contact precaution -Will use mupirocin and chlorhexidine for decolonization therapy   3-history of COPD: Continue nebulizer therapy and the use of Pulmicort -No significant wheezing or concerns for COPD exacerbation at this moment  -Will hold on steroids for now   4-dementia with behavioral disturbances: -Continue supportive care -Continue Depakote, Lexapro and as needed Ativan  -Continue Namenda   5-constipation with stool port seen on CT abdomen/pelvis  -Will continue middle ask and Senokot  -When necessary enema has been order as needed if bowel movement not achieved   6-hypokalemia: Will continue potassium supplementation and will replete as needed   7-presume chronic diastolic heart failure with slight exacerbation -Continue Lasix as mentioned above -Will check 2-D echo -Low sodium diet and daily weights -Strict intake and output   8-pressure ulcer: At the most stage I -present since admission -will use barrier creams and prevention measurements (constant reposition)   Code Status: DO NOT RESUSCITATE Family Communication: Daughter at bedside Disposition Plan: To be determine; remains inpatient for now. Still with respiratory distress and hypoxia.   Consultants:  None  Procedures:  See below for x-ray report  Antibiotics:  Levaquin 8/14  HPI/Subjective: Afebrile. With slight improvement in her breathing. No chest pain. Patient is in no acute distress. Chest x-ray has confirmed into TCU vascular  congestion/edema and lower lobe pneumonia.  Objective: Filed Vitals:   06/03/15 0959  BP: 121/66  Pulse: 89  Temp: 98.2 F (36.8 C)  Resp: 22    Intake/Output Summary (Last 24 hours) at 06/03/15 1711 Last data filed at 06/03/15 1340  Gross per 24 hour  Intake    320 ml  Output      0 ml  Net    320 ml   Filed Weights   06/02/15 0235  Weight: 54 kg (119 lb 0.8 oz)    Exam:   General:  Afebrile, wet cough appreciated on exam. No chest pain, abdominal pain, nausea/vomiting or any other complaints. Patient has been able to be downgraded from nonrebreather mask to 5 L nasal cannula.   Cardiovascular: No rubs, no gallops, regular rate. Positive systolic murmur, no JVD  Respiratory: scattered rhonchi, end expiratory wheezing appreciated on exam; decreased breath sounds at the bases  Abdomen: soft, nontender, positive bowel sounds  Musculoskeletal: no edema, no cyanosis or clubbing. Full range of motion 4 limbs.   Data Reviewed: Basic Metabolic Panel:  Recent Labs Lab Jun 11, 2015 2327 06/02/15 0315  NA 139 139  K 4.1 3.9  CL 99* 101  CO2 26 25  GLUCOSE 348* 329*  BUN 17 17  CREATININE 0.95 0.99  CALCIUM 9.9 9.1  MG  --  1.4*   CBC:  Recent Labs Lab 2015-06-11 2327 06/02/15 0315  WBC 10.4 8.3  NEUTROABS 9.2*  --   HGB 13.8 12.9  HCT 41.7 40.1  MCV 96.1 97.8  PLT 195 146*   Cardiac Enzymes:  Recent Labs Lab June 11, 2015 2327  TROPONINI <0.03   BNP (last 3 results)  Recent Labs  11-Jun-2015 2327  BNP 72.0   CBG:  Recent Labs Lab 06/02/15 0228  GLUCAP 296*    Recent  Results (from the past 240 hour(s))  Blood culture (routine x 2)     Status: None (Preliminary result)   Collection Time: 06/02/15  1:20 AM  Result Value Ref Range Status   Specimen Description BLOOD RIGHT ANTECUBITAL  Final   Special Requests BOTTLES DRAWN AEROBIC AND ANAEROBIC 5CC   Final   Culture NO GROWTH 1 DAY  Final   Report Status PENDING  Incomplete  Blood culture (routine  x 2)     Status: None (Preliminary result)   Collection Time: 06/02/15  1:22 AM  Result Value Ref Range Status   Specimen Description BLOOD LEFT HAND  Final   Special Requests BOTTLES DRAWN AEROBIC ONLY 3CC  Final   Culture NO GROWTH 1 DAY  Final   Report Status PENDING  Incomplete  MRSA PCR Screening     Status: Abnormal   Collection Time: 06/02/15 12:45 PM  Result Value Ref Range Status   MRSA by PCR POSITIVE (A) NEGATIVE Final    Comment:        The GeneXpert MRSA Assay (FDA approved for NASAL specimens only), is one component of a comprehensive MRSA colonization surveillance program. It is not intended to diagnose MRSA infection nor to guide or monitor treatment for MRSA infections. RESULT CALLED TO, READ BACK BY AND VERIFIED WITH: ADAMS,J RN 06/02/15 1835 WOOTEN,K      Studies: Dg Chest 2 View  06/03/2015   CLINICAL DATA:  Shortness of breath.  EXAM: CHEST  2 VIEW  COMPARISON:  None.  FINDINGS: Bilateral patchy pulmonary infiltrates noted consistent pneumonia. Cardiomegaly with normal pulmonary vascularity. No pleural effusion or pneumothorax.  IMPRESSION: Bilateral patchy pulmonary infiltrates most consistent with multifocal pneumonia. A component of developing pulmonary edema cannot be excluded.   Electronically Signed   By: Maisie Fus  Register   On: 06/03/2015 07:47   Ct Abdomen Pelvis W Contrast  06/02/2015   CLINICAL DATA:  Left lower quadrant pain, low back pain  EXAM: CT ABDOMEN AND PELVIS WITH CONTRAST  TECHNIQUE: Multidetector CT imaging of the abdomen and pelvis was performed using the standard protocol following bolus administration of intravenous contrast.  CONTRAST:  80mL OMNIPAQUE IOHEXOL 300 MG/ML  SOLN  COMPARISON:  None  FINDINGS: Sagittal images of the spine shows diffuse osteopenia. Degenerative changes thoracolumbar spine. There is patchy atelectasis or infiltrate bilateral lower lobe posteriorly. Fatty infiltration of the liver is noted. Status  postcholecystectomy. Large hiatal hernia measures 7.4 by 4.4 cm. Enhanced pancreas, spleen and adrenal glands are unremarkable. The kidneys show symmetrical enhancement. There is 9 mm calcified calculus in right renal pelvis nonobstructive. No hydronephrosis or hydroureter. No calcified ureteral calculi.  No small bowel obstruction. No ascites or free air. No adenopathy. Moderate stool noted in right colon. Multiple colonic diverticula are noted transverse colon. Multiple colonic diverticula descending colon and sigmoid colon. There is no evidence of acute diverticulitis. Moderate stool noted in distal sigmoid colon. Evaluation of the pelvis is limited by metallic artifacts from left hip prosthesis. There is abundant stool within rectum measures 6.6 cm in diameter highly suspicious for fecal impaction. Urinary bladder is unremarkable. Degenerative changes pubic symphysis. There is thickening of anal wall. Clinical correlation is necessary. No inguinal adenopathy.  The patient is status post hysterectomy. No pericecal inflammation. The terminal ileum is unremarkable.  IMPRESSION: 1. Fatty infiltration of the liver is noted. 2. Large hiatal hernia. 3. Status postcholecystectomy. 4. Trace right nonobstructive nephrolithiasis. No hydronephrosis or hydroureter. 5. No small bowel obstruction. 6. Patchy atelectasis  or infiltrate bilateral lower lobe posteriorly. 7. Multiple colonic diverticula. No definite evidence of acute diverticulitis. 8. Moderate stool noted in distal sigmoid colon. Abundant stool noted within distended rectum. Highly suspicious for distal fecal impaction. Limited evaluation of the pelvis from artifacts from left hip prosthesis.   Electronically Signed   By: Natasha Mead M.D.   On: 06/02/2015 16:02   Dg Chest Portable 1 View  05/31/2015   CLINICAL DATA:  Shortness of breath.  Chest pain.  EXAM: PORTABLE CHEST - 1 VIEW  COMPARISON:  One-view chest x-ray 12/27/2013.  FINDINGS: Heart size is normal.  Atherosclerotic calcifications are again noted in the aortic arch. Mild edema superimposed on chronic interstitial coarsening. No focal airspace consolidation is evident. The visualized soft tissues and bony thorax are unremarkable.  IMPRESSION: 1. Mild edema superimposed on chronic interstitial coarsening. 2. Atherosclerosis.   Electronically Signed   By: Marin Roberts M.D.   On: 06/05/2015 23:20    Scheduled Meds: . antiseptic oral rinse  7 mL Mouth Rinse q12n4p  . budesonide (PULMICORT) nebulizer solution  0.25 mg Nebulization BID  . chlorhexidine  15 mL Mouth Rinse BID  . Chlorhexidine Gluconate Cloth  6 each Topical Q0600  . divalproex  125 mg Oral 3 times per day  . docusate sodium  100 mg Oral BID  . enoxaparin (LOVENOX) injection  40 mg Subcutaneous Q24H  . escitalopram  20 mg Oral q morning - 10a  . furosemide  20 mg Intravenous Q12H  . haloperidol  5 mg Oral QPM  . ipratropium-albuterol  3 mL Nebulization TID  . levofloxacin (LEVAQUIN) IV  750 mg Intravenous Q48H  . memantine  10 mg Oral BID  . mupirocin ointment  1 application Nasal BID  . polyethylene glycol  17 g Oral BID  . potassium chloride  10 mEq Oral Daily  . raloxifene  60 mg Oral q morning - 10a  . senna-docusate  1 tablet Oral QHS   Continuous Infusions:   Active Problems:   Acute respiratory failure with hypoxia   Abdominal pain, left lower quadrant   Leukocytosis   Lactic acid acidosis   Advanced dementia   Dyspnea   Pressure ulcer    Time spent: 35 minutes    Vassie Loll  [facility] Pager [phone]. If 7PM-7AM, please contact night-coverage at [email or web address], password [facility] 06/03/2015, 5:11 PM  LOS: 1 day

## 2015-06-04 ENCOUNTER — Ambulatory Visit (HOSPITAL_COMMUNITY): Payer: Medicare Other

## 2015-06-04 DIAGNOSIS — R0600 Dyspnea, unspecified: Secondary | ICD-10-CM

## 2015-06-04 DIAGNOSIS — L8990 Pressure ulcer of unspecified site, unspecified stage: Secondary | ICD-10-CM

## 2015-06-04 DIAGNOSIS — J441 Chronic obstructive pulmonary disease with (acute) exacerbation: Secondary | ICD-10-CM

## 2015-06-04 LAB — BASIC METABOLIC PANEL
ANION GAP: 10 (ref 5–15)
BUN: 19 mg/dL (ref 6–20)
CALCIUM: 9.2 mg/dL (ref 8.9–10.3)
CO2: 31 mmol/L (ref 22–32)
CREATININE: 0.91 mg/dL (ref 0.44–1.00)
Chloride: 104 mmol/L (ref 101–111)
GFR calc non Af Amer: 53 mL/min — ABNORMAL LOW (ref >60)
Glucose, Bld: 268 mg/dL — ABNORMAL HIGH (ref 65–99)
Potassium: 3.2 mmol/L — ABNORMAL LOW (ref 3.5–5.1)
SODIUM: 145 mmol/L (ref 135–145)

## 2015-06-04 LAB — CBC
HEMATOCRIT: 35 % — AB (ref 36.0–46.0)
HEMOGLOBIN: 11.3 g/dL — AB (ref 12.0–15.0)
MCH: 31.7 pg (ref 26.0–34.0)
MCHC: 32.3 g/dL (ref 30.0–36.0)
MCV: 98.3 fL (ref 78.0–100.0)
Platelets: 100 10*3/uL — ABNORMAL LOW (ref 150–400)
RBC: 3.56 MIL/uL — ABNORMAL LOW (ref 3.87–5.11)
RDW: 12.9 % (ref 11.5–15.5)
WBC: 10.2 10*3/uL (ref 4.0–10.5)

## 2015-06-04 MED ORDER — METHYLPREDNISOLONE SODIUM SUCC 40 MG IJ SOLR
40.0000 mg | Freq: Two times a day (BID) | INTRAMUSCULAR | Status: DC
  Administered 2015-06-04 – 2015-06-06 (×4): 40 mg via INTRAVENOUS
  Filled 2015-06-04 (×4): qty 1

## 2015-06-04 MED ORDER — POTASSIUM CHLORIDE CRYS ER 20 MEQ PO TBCR
20.0000 meq | EXTENDED_RELEASE_TABLET | Freq: Every day | ORAL | Status: DC
  Filled 2015-06-04: qty 1

## 2015-06-04 MED ORDER — MAGNESIUM SULFATE 2 GM/50ML IV SOLN
2.0000 g | Freq: Once | INTRAVENOUS | Status: AC
  Administered 2015-06-04: 2 g via INTRAVENOUS
  Filled 2015-06-04: qty 50

## 2015-06-04 NOTE — Progress Notes (Signed)
Utilization review completed. Manasvini Vennie Waymire, RN, BSN. 

## 2015-06-04 NOTE — Progress Notes (Signed)
Pt oxygen saturations fluctuating between 86-90% on non-re breather O2 mask. MD made aware. Will continue to monitor pt.  Lujean Amel

## 2015-06-04 NOTE — Progress Notes (Signed)
[facility] PROGRESS NOTE  Hannah Spears GNF:[id number] DOB: Jun 04, 1920 DOA: 05/27/2015 PCP: Minda Meo, MD  Assessment/Plan: 1-acute respiratory failure with hypoxia: Appears to be secondary to interstitial edema and community-acquired pneumonia -Patient will be treated with IV Lasix, antibiotics, nebulizer therapy, mucolytic's and oxygen supplementation. Will add steroids for wheezing and presumed mild COPD exacerbation -Will provide supportive care and adjust therapy as needed.  2-positive MRSA: Patient is on contact precaution -Will use mupirocin and chlorhexidine for decolonization therapy   3-history of COPD with mild exacerbation: Continue nebulizer therapy and the use of Pulmicort -end exp wheezing appreciated, will add steroids -follow clinical response  -continue oxigen supplementation and wean as tolerated   4-dementia with behavioral disturbances: -Continue supportive care -Continue Depakote, Lexapro and as needed Ativan  -Continue Namenda   5-constipation with stool port seen on CT abdomen/pelvis  -Will continue miralax and Senokot  -When necessary enema has been order as needed if bowel movement not achieved   6-hypokalemia: Will continue potassium supplementation and replete as needed  -will check Mg level  7-acute on chronic diastolic heart failure: EF preserved and no wall motion abnormalities  -Continue Lasix as mentioned above -2-D echo with preserved Ef; grade 1 diastolic heart failure -continue Low sodium diet and daily weights -Strict intake and output   8-pressure ulcer: At the most stage I -present since admission -will use barrier creams and prevention measurements (constant reposition)   Code Status: DO NOT RESUSCITATE Family Communication: Daughter at bedside Disposition Plan: To be determine; remains inpatient for now. Still with respiratory distress and hypoxia.   Consultants:  None  Procedures:  See below for x-ray  report  2-D echo  - Left ventricle: The cavity size was normal. Wall thickness was normal. Systolic function was normal. The estimated ejection fraction was in the range of 60% to 65%. Wall motion was normal; there were no regional wall motion abnormalities. Doppler parameters are consistent with abnormal left ventricular relaxation (grade 1 diastolic dysfunction). - Aortic valve: There was no stenosis. - Mitral valve: There was no significant regurgitation. - Right ventricle: The cavity size was normal. Systolic function was normal. - Tricuspid valve: Peak RV-RA gradient (S): 24 mm Hg. - Pulmonary arteries: PA peak pressure: 27 mm Hg (S). - Inferior vena cava: The vessel was normal in size. The respirophasic diameter changes were in the normal range (>= 50%), consistent with normal central venous pressure.  Impressions:  - Normal LV size with EF 60-65%. Normal RV size and systolic function. No significant valvular abnormalities.  Antibiotics:  Levaquin 8/14  HPI/Subjective: Low grade temp, positive wheezing and worsening breathing status; back on nonrebreathing mask.  Objective: Filed Vitals:   06/04/15 1723  BP: 187/79  Pulse: 116  Temp: 100 F (37.8 C)  Resp: 26    Intake/Output Summary (Last 24 hours) at 06/04/15 1830 Last data filed at 06/04/15 0600  Gross per 24 hour  Intake    120 ml  Output      3 ml  Net    117 ml   Filed Weights   06/02/15 0235  Weight: 54 kg (119 lb 0.8 oz)    Exam:   General:  Low grade temp, wet cough appreciated on exam. No chest pain, abdominal pain, nausea/vomiting or any other complaints. Patient breathing worsening and requiring nonrebreathing mask again.   Cardiovascular: No rubs, no gallops, regular rate. Positive systolic murmur, no JVD  Respiratory: diffuse rhonchi, positive expiratory wheezing appreciated on exam; decreased breath sounds at  the bases  Abdomen: soft, nontender, positive bowel  sounds  Musculoskeletal: no edema, no cyanosis or clubbing. Full range of motion 4 limbs.   Data Reviewed: Basic Metabolic Panel:  Recent Labs Lab 06-18-15 2327 06/02/15 0315 06/04/15 0527  NA 139 139 145  K 4.1 3.9 3.2*  CL 99* 101 104  CO2 GLUCOSE 348* 329* 268*  BUN CREATININE 0.95 0.99 0.91  CALCIUM 9.9 9.1 9.2  MG  --  1.4*  --    CBC:  Recent Labs Lab 06/18/2015 2327 06/02/15 0315 06/04/15 0527  WBC 10.4 8.3 10.2  NEUTROABS 9.2*  --   --   HGB 13.8 12.9 11.3*  HCT 41.7 40.1 35.0*  MCV 96.1 97.8 98.3  PLT 195 146* 100*   Cardiac Enzymes:  Recent Labs Lab 06-18-15 2327  TROPONINI <0.03   BNP (last 3 results)  Recent Labs  Jun 18, 2015 2327  BNP 72.0   CBG:  Recent Labs Lab 06/02/15 0228  GLUCAP 296*    Recent Results (from the past 240 hour(s))  Blood culture (routine x 2)     Status: None (Preliminary result)   Collection Time: 06/02/15  1:20 AM  Result Value Ref Range Status   Specimen Description BLOOD RIGHT ANTECUBITAL  Final   Special Requests BOTTLES DRAWN AEROBIC AND ANAEROBIC 5CC   Final   Culture NO GROWTH 2 DAYS  Final   Report Status PENDING  Incomplete  Blood culture (routine x 2)     Status: None (Preliminary result)   Collection Time: 06/02/15  1:22 AM  Result Value Ref Range Status   Specimen Description BLOOD LEFT HAND  Final   Special Requests BOTTLES DRAWN AEROBIC ONLY 3CC  Final   Culture NO GROWTH 2 DAYS  Final   Report Status PENDING  Incomplete  MRSA PCR Screening     Status: Abnormal   Collection Time: 06/02/15 12:45 PM  Result Value Ref Range Status   MRSA by PCR POSITIVE (A) NEGATIVE Final    Comment:        The GeneXpert MRSA Assay (FDA approved for NASAL specimens only), is one component of a comprehensive MRSA colonization surveillance program. It is not intended to diagnose MRSA infection nor to guide or monitor treatment for MRSA infections. RESULT CALLED TO, READ BACK BY AND  VERIFIED WITH: ADAMS,J RN 06/02/15 1835 WOOTEN,K      Studies: Dg Chest 2 View  06/03/2015   CLINICAL DATA:  Shortness of breath.  EXAM: CHEST  2 VIEW  COMPARISON:  None.  FINDINGS: Bilateral patchy pulmonary infiltrates noted consistent pneumonia. Cardiomegaly with normal pulmonary vascularity. No pleural effusion or pneumothorax.  IMPRESSION: Bilateral patchy pulmonary infiltrates most consistent with multifocal pneumonia. A component of developing pulmonary edema cannot be excluded.   Electronically Signed   By: Maisie Fus  Register   On: 06/03/2015 07:47    Scheduled Meds: . antiseptic oral rinse  7 mL Mouth Rinse q12n4p  . budesonide (PULMICORT) nebulizer solution  0.25 mg Nebulization BID  . chlorhexidine  15 mL Mouth Rinse BID  . Chlorhexidine Gluconate Cloth  6 each Topical Q0600  . divalproex  125 mg Oral 3 times per day  . docusate sodium  100 mg Oral BID  . escitalopram  20 mg Oral q morning - 10a  . furosemide  20 mg Intravenous Q12H  . haloperidol  5 mg Oral QPM  . ipratropium-albuterol  3 mL Nebulization TID  . levofloxacin  750 mg Oral Q48H  . magnesium sulfate 1 - 4 g bolus IVPB  2 g Intravenous Once  . memantine  10 mg Oral BID  . mupirocin ointment  1 application Nasal BID  . [START ON 06/05/2015] potassium chloride  20 mEq Oral Daily  . raloxifene  60 mg Oral q morning - 10a  . senna-docusate  1 tablet Oral QHS   Continuous Infusions:   Active Problems:   Acute respiratory failure with hypoxia   Abdominal pain, left lower quadrant   Leukocytosis   Lactic acid acidosis   Advanced dementia   Dyspnea   Pressure ulcer    Time spent: 35 minutes    Vassie Loll  [facility] Pager [phone]. If 7PM-7AM, please contact night-coverage at [email or web address], password [facility] 06/04/2015, 6:30 PM  LOS: 2 days

## 2015-06-05 ENCOUNTER — Inpatient Hospital Stay (HOSPITAL_COMMUNITY): Payer: Medicare Other

## 2015-06-05 DIAGNOSIS — R0602 Shortness of breath: Secondary | ICD-10-CM | POA: Insufficient documentation

## 2015-06-05 DIAGNOSIS — Z515 Encounter for palliative care: Secondary | ICD-10-CM | POA: Insufficient documentation

## 2015-06-05 DIAGNOSIS — R0902 Hypoxemia: Secondary | ICD-10-CM | POA: Insufficient documentation

## 2015-06-05 DIAGNOSIS — J9601 Acute respiratory failure with hypoxia: Secondary | ICD-10-CM

## 2015-06-05 DIAGNOSIS — R0600 Dyspnea, unspecified: Secondary | ICD-10-CM

## 2015-06-05 LAB — BASIC METABOLIC PANEL
ANION GAP: 12 (ref 5–15)
BUN: 30 mg/dL — ABNORMAL HIGH (ref 6–20)
CHLORIDE: 105 mmol/L (ref 101–111)
CO2: 30 mmol/L (ref 22–32)
Calcium: 9.1 mg/dL (ref 8.9–10.3)
Creatinine, Ser: 1.08 mg/dL — ABNORMAL HIGH (ref 0.44–1.00)
GFR, EST AFRICAN AMERICAN: 50 mL/min — AB (ref >60)
GFR, EST NON AFRICAN AMERICAN: 43 mL/min — AB (ref >60)
Glucose, Bld: 370 mg/dL — ABNORMAL HIGH (ref 65–99)
POTASSIUM: 3.2 mmol/L — AB (ref 3.5–5.1)
Sodium: 147 mmol/L — ABNORMAL HIGH (ref 135–145)

## 2015-06-05 LAB — CBC
HCT: 37.3 % (ref 36.0–46.0)
HEMOGLOBIN: 11.8 g/dL — AB (ref 12.0–15.0)
MCH: 31.5 pg (ref 26.0–34.0)
MCHC: 31.6 g/dL (ref 30.0–36.0)
MCV: 99.5 fL (ref 78.0–100.0)
PLATELETS: 97 10*3/uL — AB (ref 150–400)
RBC: 3.75 MIL/uL — AB (ref 3.87–5.11)
RDW: 13.1 % (ref 11.5–15.5)
WBC: 8.2 10*3/uL (ref 4.0–10.5)

## 2015-06-05 LAB — MAGNESIUM: MAGNESIUM: 2.5 mg/dL — AB (ref 1.7–2.4)

## 2015-06-05 MED ORDER — VANCOMYCIN HCL 500 MG IV SOLR
500.0000 mg | INTRAVENOUS | Status: DC
  Filled 2015-06-05: qty 500

## 2015-06-05 MED ORDER — POTASSIUM CHLORIDE 10 MEQ/100ML IV SOLN
10.0000 meq | INTRAVENOUS | Status: AC
  Administered 2015-06-05 (×2): 10 meq via INTRAVENOUS
  Filled 2015-06-05 (×2): qty 100

## 2015-06-05 MED ORDER — POTASSIUM CHLORIDE 10 MEQ/100ML IV SOLN
10.0000 meq | INTRAVENOUS | Status: AC
  Administered 2015-06-05: 10 meq via INTRAVENOUS
  Filled 2015-06-05: qty 100

## 2015-06-05 MED ORDER — PIPERACILLIN-TAZOBACTAM 3.375 G IVPB
3.3750 g | Freq: Three times a day (TID) | INTRAVENOUS | Status: DC
  Administered 2015-06-05: 3.375 g via INTRAVENOUS
  Filled 2015-06-05 (×6): qty 50

## 2015-06-05 MED ORDER — INSULIN ASPART 100 UNIT/ML ~~LOC~~ SOLN: 0.0000 [IU] | Freq: Three times a day (TID) | SUBCUTANEOUS | Status: DC

## 2015-06-05 MED ORDER — FUROSEMIDE 10 MG/ML IJ SOLN
40.0000 mg | Freq: Once | INTRAMUSCULAR | Status: AC
  Administered 2015-06-05: 40 mg via INTRAVENOUS
  Filled 2015-06-05: qty 4

## 2015-06-05 MED ORDER — VANCOMYCIN HCL IN DEXTROSE 750-5 MG/150ML-% IV SOLN
750.0000 mg | Freq: Once | INTRAVENOUS | Status: AC
  Administered 2015-06-05: 750 mg via INTRAVENOUS
  Filled 2015-06-05: qty 150

## 2015-06-05 MED ORDER — VALPROATE SODIUM 500 MG/5ML IV SOLN
125.0000 mg | Freq: Three times a day (TID) | INTRAVENOUS | Status: DC
  Administered 2015-06-05 – 2015-06-06 (×3): 125 mg via INTRAVENOUS
  Filled 2015-06-05 (×6): qty 1.25

## 2015-06-05 MED ORDER — ACETAMINOPHEN 650 MG RE SUPP
650.0000 mg | RECTAL | Status: DC | PRN
  Filled 2015-06-05: qty 1

## 2015-06-05 MED ORDER — LORAZEPAM 2 MG/ML IJ SOLN: 0.5000 mg | Freq: Three times a day (TID) | INTRAMUSCULAR | Status: DC | PRN

## 2015-06-05 MED ORDER — MORPHINE SULFATE (PF) 2 MG/ML IV SOLN
0.5000 mg | INTRAVENOUS | Status: DC | PRN
  Administered 2015-06-05 – 2015-06-06 (×2): 0.5 mg via INTRAVENOUS
  Filled 2015-06-05 (×2): qty 1

## 2015-06-05 MED ORDER — LEVOFLOXACIN IN D5W 750 MG/150ML IV SOLN: 750.0000 mg | INTRAVENOUS | Status: DC

## 2015-06-05 NOTE — Progress Notes (Signed)
ANTIBIOTIC CONSULT NOTE - INITIAL  Pharmacy Consult for Vancomycin + Zosyn Indication: Empiric HCAP coverage  Allergies  Allergen Reactions  . Lidocaine Other (See Comments)    Per [facility]    Patient Measurements: Height:  (157.5 cm) Weight: 118 lb 6.2 oz (53.7 kg) IBW/kg (Calculated) : 50.1  Vital Signs: Temp: 99 F (37.2 C) (08/17 0816) Temp Source: Oral (08/17 0816) BP: 180/78 mmHg (08/17 0816) Pulse Rate: 100 (08/17 0816) Intake/Output from previous day: 08/16 0701 - 08/17 0700 In: 150 [P.O.:150] Out: 3 [Urine:3] Intake/Output from this shift:    Labs:  Recent Labs  06/04/15 0527 06/05/15 0544  WBC 10.2 8.2  HGB 11.3* 11.8*  PLT 100* 97*  CREATININE 0.91 1.08*   Estimated Creatinine Clearance: 25.7 mL/min (by C-G formula based on Cr of 1.08). No results for input(s): VANCOTROUGH, VANCOPEAK, VANCORANDOM, GENTTROUGH, GENTPEAK, GENTRANDOM, TOBRATROUGH, TOBRAPEAK, TOBRARND, AMIKACINPEAK, AMIKACINTROU, AMIKACIN in the last 72 hours.   Microbiology: Recent Results (from the past 720 hour(s))  Blood culture (routine x 2)     Status: None (Preliminary result)   Collection Time: 06/02/15  1:20 AM  Result Value Ref Range Status   Specimen Description BLOOD RIGHT ANTECUBITAL  Final   Special Requests BOTTLES DRAWN AEROBIC AND ANAEROBIC 5CC   Final   Culture NO GROWTH 2 DAYS  Final   Report Status PENDING  Incomplete  Blood culture (routine x 2)     Status: None (Preliminary result)   Collection Time: 06/02/15  1:22 AM  Result Value Ref Range Status   Specimen Description BLOOD LEFT HAND  Final   Special Requests BOTTLES DRAWN AEROBIC ONLY 3CC  Final   Culture NO GROWTH 2 DAYS  Final   Report Status PENDING  Incomplete  MRSA PCR Screening     Status: Abnormal   Collection Time: 06/02/15 12:45 PM  Result Value Ref Range Status   MRSA by PCR POSITIVE (A) NEGATIVE Final    Comment:        The GeneXpert MRSA Assay (FDA approved for NASAL specimens only), is one  component of a comprehensive MRSA colonization surveillance program. It is not intended to diagnose MRSA infection nor to guide or monitor treatment for MRSA infections. RESULT CALLED TO, READ BACK BY AND VERIFIED WITH: ADAMS,J RN 06/02/15 1835 WOOTEN,K     Medical History: Past Medical History  Diagnosis Date  . Dementia   . Hypertension   . Osteoarthritis   . Diabetes mellitus   . COPD (chronic obstructive pulmonary disease)   . Urinary urgency   . Hearing loss   . Alzheimer's dementia     Assessment: 41 YOF who presented on 8/14 with SOB and was initially started on Levaquin for r/o PNA. Pharmacy now asked to broaden to Vancomycin + Zosyn in the setting of worsening hypoxia. Wt: 53.7 kg, SCr 1.08, CrCl~20-30 ml/min.   Pharmacy has also been consulted to switch the patient's home Depakote from po to IV. Will start valproic acid at 125 mg IV every 8 hours.   Goal of Therapy:  Vancomycin trough level 15-20 mcg/ml  Plan:  1. Vancomycin 750 mg IV x 1 followed by 500 mg IV every 24 hours 2. Zosyn 3.375g IV every 8 hours (infused over 4 hours) 3. Start Valproic acid 125 mg IV every 8 hours 4. Will continue to follow renal function, culture results, LOT, and antibiotic de-escalation plans   Georgina Pillion, PharmD, BCPS Clinical Pharmacist Pager: [phone] 06/05/2015 12:28 PM

## 2015-06-05 NOTE — Progress Notes (Signed)
Pt found to be unresponsive with NRB on when RT got to room. Pt spo2 alarming on bedside PO at 83%, despite NRB. Pt with audible crackles. Pt NT suctioned x 2 each nare with large amounts of yellow/brown/tan tenacious/thick secretions removed. Pt sounding much better but still not responding or increasing SPO2. RN made aware. RT will continue to monitor.

## 2015-06-05 NOTE — Consult Note (Signed)
Consultation Note Date: 06/05/2015   Patient Name: Hannah Spears  DOB: Jan 07, 1920  MRN: [id number]  Age / Sex: 81 y.o., female   PCP: Burnard Bunting, MD Referring Physician: Elmarie Shiley, MD  Reason for Consultation: Establishing goals of care  Palliative Care Assessment and Plan Summary of Established Goals of Care and Medical Treatment Preferences   Clinical Assessment/Narrative: Hannah Spears is a 81 year old female with past medical history of dementia hypertension and diabetes COPD frequent UTIs and Alzheimer type dementia who has been admitted for treatment of acute respiratory failure with hypoxia related to community-acquired pneumonia as well as COPD exacerbation. She has been on antibiotic therapy with little response. Palliative was consulted regarding goals of care in light of Hannah chronic medical problems and poor response to treatment with respiratory failure with hypoxia.  I met with patient's daughter who reports that she is last living relative of Hannah Spears. She states that Hannah Spears has lived a good life, and she thinks that she would be most interested in pursuing comfort at this time. She does note that she has relatives from out of town who will be returning in the next day or so. She would like to continue to focus first and foremost on Hannah Spears's comfort, however, she would also like to continue with current therapies including IV antibiotics for another 24 hours to see if she does show any signs of improvement.  I had a long discussion with Hannah regarding this, including that it appeared to me that Hannah Spears was entering the phase of actively dying. She reports that she understands that most likely Hannah Spears will die from this regardless of treatment. She says if Hannah condition worsens that she would not like to escalate care in any way in the main focus should remain on Hannah Spears's comfort. She would also like to meet again tomorrow to review the overall situation.  She reports that at that time she is likely to ask to stop antibiotics and focus strictly on a comfort based upon.  She was in agreement the addition of any medications that may contribute to Hannah Spears's comfort was in line with Hannah wishes. This included low-dose morphine for symptomatic relief of dyspnea. We discussed the risk versus benefit of this in context of respiratory depression associated with narcotic use. She was in agreement that this is something that we should continue to utilize.  Burnis Medin plan to follow-up with patient tomorrow in order to continue discussion with Hannah daughter and ensure good symptom management. She will likely transition to full comfort measures at that time  Contacts/Participants in Discussion: Primary Decision Maker: [name]   HCPOA: yes per Hannah report. No documentation on chart  Code Status/Advance Care Planning:  DNR  Symptom Management:   Dyspnea: Agree with starting morphine IV every 2 hours when necessary. Patient did have some tachypnea during my examination. I asked nursing to administer half milligram IV of morphine following my encounter. If this does not relieve increased work of breathing and patient still tachypnea, would recommend 1 mg dose in 2 hours. If patient is receiving around-the-clock, would have low threshold to begin continuous infusion.  Additional Recommendations (Limitations, Scope, Preferences):  [name] would like to continue with limited interventions that she is currently receiving overnight and reassess Hannah Spears's condition in the morning. She reports that she will likely want to pursue strictly comfort measure approach at that time. She is going to call and discuss the critical nature  of illness with the rest of Hannah family. Psycho-social/Spiritual:   Support System: Daughter  Desire for further Chaplaincy support:no  Prognosis: Hours - Days likely terminal admission. I suspect the patient has  entered phase of actively dying.  Discharge Planning:  Likely terminal admission       Chief Complaint/History of Present Illness:  Hannah Spears is a 81 year old female with past medical history of dementia hypertension and diabetes COPD frequent UTIs and Alzheimer type dementia who has been admitted for treatment of acute respiratory failure with hypoxia related to community-acquired pneumonia as well as COPD exacerbation. She has been on antibiotic therapy with little response. Palliative was consulted regarding goals of care in light of Hannah chronic medical problems and poor response to treatment with respiratory failure with hypoxia.  The patient is unresponsive. I met with Hannah daughter to discuss goals of care.  Primary Diagnoses  Present on Admission:  . Leukocytosis . Dyspnea  Palliative Review of Systems: Unable to obtain I have reviewed the medical record, interviewed the patient and family, and examined the patient. The following aspects are pertinent.  Past Medical History  Diagnosis Date  . Dementia   . Hypertension   . Osteoarthritis   . Diabetes mellitus   . COPD (chronic obstructive pulmonary disease)   . Urinary urgency   . Hearing loss   . Alzheimer's dementia    Social History   Social History  . Marital Status: Divorced    Spouse Name: N/A  . Number of Children: N/A  . Years of Education: N/A   Social History Main Topics  . Smoking status: Never Smoker   . Smokeless tobacco: None  . Alcohol Use: No  . Drug Use: No  . Sexual Activity: No   Other Topics Concern  . None   Social History Narrative   No family history on file. Scheduled Meds: . antiseptic oral rinse  7 mL Mouth Rinse q12n4p  . budesonide (PULMICORT) nebulizer solution  0.25 mg Nebulization BID  . chlorhexidine  15 mL Mouth Rinse BID  . Chlorhexidine Gluconate Cloth  6 each Topical Q0600  . docusate sodium  100 mg Oral BID  . escitalopram  20 mg Oral q morning - 10a  . furosemide   20 mg Intravenous Q12H  . insulin aspart  0-9 Units Subcutaneous TID WC  . ipratropium-albuterol  3 mL Nebulization TID  . methylPREDNISolone (SOLU-MEDROL) injection  40 mg Intravenous Q12H  . mupirocin ointment  1 application Nasal BID  . piperacillin-tazobactam (ZOSYN)  IV  3.375 g Intravenous 3 times per day  . potassium chloride  20 mEq Oral Daily  . senna-docusate  1 tablet Oral QHS  . valproate sodium  125 mg Intravenous 3 times per day  . [START ON 06/19/2015] vancomycin  500 mg Intravenous Q24H  . vancomycin  750 mg Intravenous Once   Continuous Infusions:  PRN Meds:.LORazepam, morphine injection, ondansetron **OR** ondansetron (ZOFRAN) IV, sodium phosphate Medications Prior to Admission:  Prior to Admission medications   Medication Sig Start Date End Date Taking? Authorizing Provider  acetaminophen (TYLENOL ARTHRITIS PAIN) 650 MG CR tablet Take 1,300 mg by mouth every 8 (eight) hours.   Yes Historical Provider, MD  Calcium Carbonate-Vitamin D (CALCARB 600/D) 600-400 MG-UNIT per tablet Take 1 tablet by mouth 2 (two) times daily.   Yes Historical Provider, MD  cholecalciferol (VITAMIN D) 1000 UNITS tablet Take 1,000 Units by mouth every morning.    Yes Historical Provider, MD  Cranberry 450 MG  TABS Take 450 mg by mouth 2 (two) times daily.   Yes Historical Provider, MD  diphenhydrAMINE (BENADRYL) 25 mg capsule Take 25 mg by mouth every 6 (six) hours as needed for itching or allergies.    Yes Historical Provider, MD  divalproex (DEPAKOTE SPRINKLE) 125 MG capsule Take 125 mg by mouth 3 (three) times daily.    Yes Historical Provider, MD  docusate sodium (COLACE) 100 MG capsule Take 100 mg by mouth 2 (two) times daily.   Yes Historical Provider, MD  escitalopram (LEXAPRO) 20 MG tablet Take 20 mg by mouth every morning.    Yes Historical Provider, MD  furosemide (LASIX) 10 MG/ML solution Take 40 mg by mouth every morning.   Yes Historical Provider, MD  haloperidol (HALDOL) 5 MG tablet  Take 5 mg by mouth every evening.    Yes Historical Provider, MD  HYDROcodone-acetaminophen (NORCO/VICODIN) 5-325 MG per tablet Take 1 tablet by mouth every 6 (six) hours as needed for moderate pain.   Yes Historical Provider, MD  LORazepam (ATIVAN) 0.5 MG tablet Take 0.25 mg by mouth 2 (two) times daily as needed for anxiety.   Yes Historical Provider, MD  LORazepam (ATIVAN) 2 MG/ML injection Inject 0.5 mg into the muscle every 8 (eight) hours as needed for anxiety.   Yes Historical Provider, MD  memantine (NAMENDA) 10 MG tablet Take 10 mg by mouth 2 (two) times daily.    Yes Historical Provider, MD  Multiple Vitamin (MULTIVITAMIN WITH MINERALS) TABS tablet Take 1 tablet by mouth every morning.   Yes Historical Provider, MD  polyethylene glycol (MIRALAX / GLYCOLAX) packet Take 17 g by mouth every morning.    Yes Historical Provider, MD  potassium chloride (MICRO-K) 10 MEQ CR capsule Take 10 mEq by mouth daily.   Yes Historical Provider, MD  raloxifene (EVISTA) 60 MG tablet Take 60 mg by mouth every morning.    Yes Historical Provider, MD  senna-docusate (SENOKOT-S) 8.6-50 MG per tablet Take 1 tablet by mouth at bedtime.   Yes Historical Provider, MD  terbutaline (BRETHINE) 2.5 MG tablet Take 5 mg by mouth every morning.    Yes Historical Provider, MD   Allergies  Allergen Reactions  . Lidocaine Other (See Comments)    Per MAR   CBC:    Component Value Date/Time   WBC 8.2 06/05/2015 0544   HGB 11.8* 06/05/2015 0544   HCT 37.3 06/05/2015 0544   PLT 97* 06/05/2015 0544   MCV 99.5 06/05/2015 0544   NEUTROABS 9.2* 06/16/2015 2327   LYMPHSABS 0.6* 05/27/2015 2327   MONOABS 0.5 05/31/2015 2327   EOSABS 0.1 06/05/2015 2327   BASOSABS 0.0 06/03/2015 2327   Comprehensive Metabolic Panel:    Component Value Date/Time   NA 147* 06/05/2015 0544   K 3.2* 06/05/2015 0544   CL 105 06/05/2015 0544   CO2 30 06/05/2015 0544   BUN 30* 06/05/2015 0544   CREATININE 1.08* 06/05/2015 0544   GLUCOSE  370* 06/05/2015 0544   CALCIUM 9.1 06/05/2015 0544   AST 22 12/27/2013 1332   ALT 13 12/27/2013 1332   ALKPHOS 55 12/27/2013 1332   BILITOT 0.4 12/27/2013 1332   PROT 6.2 12/27/2013 1332   ALBUMIN 3.0* 12/27/2013 1332    Physical Exam: Vital Signs: BP 180/78 mmHg  Pulse 100  Temp(Src) 99 F (37.2 C) (Oral)  Resp 24  Ht $R'5\' 2"'pH$  (1.575 m)  Wt 53.7 kg (118 lb 6.2 oz)  BMI 21.65 kg/m2  SpO2 89% SpO2: SpO2: Marland Kitchen)  89 % O2 Device: O2 Device: NRB O2 Flow Rate: O2 Flow Rate (L/min): 15 L/min Intake/output summary:  Intake/Output Summary (Last 24 hours) at 06/05/15 1824 Last data filed at 06/05/15 1500  Gross per 24 hour  Intake      0 ml  Output      0 ml  Net      0 ml   LBM: Last BM Date: 06/04/15 Baseline Weight: Weight: 54 kg (119 lb 0.8 oz) Most recent weight: Weight: 53.7 kg (118 lb 6.2 oz)  Exam Findings:  Gen.: Elderly female lying in bed with face mask in place. Does not respond to verbal or tactile stimulation. ENT: Mucous membranes tacky Cardiovascular: Regular, systolic ejection murmur noted Lungs: Diminished throughout. Scattered rhonchi throughout. Shallow breaths with some irregularity noted. Abd: Soft with positive bowel sounds in 4 quadrants Ext: Some peripheral edema noted. Psych: Unresponsive          Palliative Performance Scale: 10%              Additional Data Reviewed: Recent Labs     06/04/15  0527  06/05/15  0544  WBC  10.2  8.2  HGB  11.3*  11.8*  PLT  100*  97*  NA  145  147*  BUN  19  30*  CREATININE  0.91  1.08*     Time In: 15:00 Time Out: 16:15 Time Total: 80 Greater than 50%  of this time was spent counseling and coordinating care related to the above assessment and plan.  Signed by: Micheline Rough, MD  Micheline Rough, MD  06/05/2015, 6:24 PM  Please contact Palliative Medicine Team phone at [phone] for questions and concerns.

## 2015-06-05 NOTE — Progress Notes (Signed)
Nutrition Brief Note  Pt identified for a low braden score. Chart reviewed. Per MD note, family would like pt to be comfort care, except for antibiotics, IV medications, oxygen, and suction. Pt does not wish to receive extreme measures of care. No further nutrition interventions warranted at this time. Please consult as needed.   Jessel Gettinger Roslyn SmilingD, LDN Pager # [phone] After hours/ weekend pager # [phone]

## 2015-06-05 NOTE — Progress Notes (Signed)
Pt on NRB with sp02 currently at 91%. Unable to give pt meds as she is very lethargic. Pt only responds to sternal rub. Oral care provided. Will continue to monitor.

## 2015-06-05 NOTE — Progress Notes (Signed)
[facility] PROGRESS NOTE  Hannah Spears ZOX:[id number] DOB: [birth year] DOA: 05/31/2015 PCP: Minda Meo, MD  Assessment/Plan: 1-Acute respiratory failure with hypoxia: Appears to be secondary to interstitial edema and community-acquired pneumonia -Patient will be treated with IV Lasix, antibiotics, nebulizer therapy, mucolytic's and oxygen supplementation. On  steroids for wheezing and presumed mild COPD exacerbation -worsening hypoxemia, chest x ray stable PNA.  -discussed with daughter, poor prognosis. Daughter agree with comfort measure.  -IV morphine for increase work of breathing.  -no further labs drawn.  -Palliative care consulted for further goals of care and symptoms management.   2-Encephalopathy; multifactorial, hypoxemia, infectious process,   3-History of COPD with mild exacerbation: Continue nebulizer therapy and the use of Pulmicort -nebulizer , solumedrol.   4-Dementia with behavioral disturbances: -Continue supportive care -Continue Depakote, Lexapro and as needed Ativan  -Hold  Namenda   5-Constipation with stool port seen on CT abdomen/pelvis  -Will continue miralax and Senokot  -When necessary enema has been order as needed if bowel movement not achieved   6-Hypokalemia; replace IV.   7-Acute on chronic diastolic heart failure: EF preserved and no wall motion abnormalities  -Continue Lasix . Extra dose of IV lasix.  -2-D echo with preserved Ef; grade 1 diastolic heart failure -continue Low sodium diet and daily weights -Strict intake and output   8-Pressure ulcer: At the most stage I -present since admission -will use barrier creams and prevention measurements (constant reposition)   positive MRSA: Patient is on contact precaution -Will use mupirocin and chlorhexidine for decolonization therapy  DVT prophylaxis; she was getting Lovenox for DVT prophylaxis, stop on 8-16 suspect related  To thrombocytopenia.   Code Status: DO NOT  RESUSCITATE Family Communication: Daughter at bedside Disposition Plan: poor prognosis, expect hospital death. Palliative care consulted.    Consultants:  None  Procedures:  See below for x-ray report  2-D echo  - Left ventricle: The cavity size was normal. Wall thickness was normal. Systolic function was normal. The estimated ejection fraction was in the range of 60% to 65%. Wall motion was normal; there were no regional wall motion abnormalities. Doppler parameters are consistent with abnormal left ventricular relaxation (grade 1 diastolic dysfunction). - Aortic valve: There was no stenosis. - Mitral valve: There was no significant regurgitation. - Right ventricle: The cavity size was normal. Systolic function was normal. - Tricuspid valve: Peak RV-RA gradient (S): 24 mm Hg. - Pulmonary arteries: PA peak pressure: 27 mm Hg (S). - Inferior vena cava: The vessel was normal in size. The respirophasic diameter changes were in the normal range (>= 50%), consistent with normal central venous pressure.  Impressions:  - Normal LV size with EF 60-65%. Normal RV size and systolic function. No significant valvular abnormalities.  Antibiotics:  Levaquin 8/14---Vancomycin and Zosyn 8-17  HPI/Subjective: Patient is lethargic, no responsive. She has become more hypoxic. Oxygen sat in lows 90 on NBK 15 L.  I order chest x ray,. IV lasix, change antibiotics to IV.  I discussed care with daughter. Patient with very poor prognosis. Patient already DNR, DNI. Patient very lethargic for BIPAP.  Daughter agree with comfort measure. She agree with low dose morphine for respiratory distress. She understand morphine can cause respiratory suppression and make patient more lethargic. Wants to focus on comfort. Patient has history of dementia and has not been doing well lately. Will continue with IV lasix and IV antibiotics for now. Palliative care consult for symptoms management  and further goals of care. Daughter will inform  rest of family about patient critical situation.   Objective: Filed Vitals:   06/05/15 0816  BP: 180/78  Pulse: 100  Temp: 99 F (37.2 C)  Resp: 24    Intake/Output Summary (Last 24 hours) at 06/05/15 1208 Last data filed at 06/05/15 0600  Gross per 24 hour  Intake      0 ml  Output      2 ml  Net     -2 ml   Filed Weights   06/02/15 0235 06/05/15 0538  Weight: 54 kg (119 lb 0.8 oz) 53.7 kg (118 lb 6.2 oz)    Exam:   General:   Patient breathing worsening and requiring nonrebreathing mask again. Lethargic, increase work of breathing.   Cardiovascular: No rubs, no gallops, regular rate. Positive systolic murmur.   Respiratory;  decreased breath sounds at the bases, bilateral ronchus, crackles.   Abdomen: soft, nontender, positive bowel sounds  Musculoskeletal: no edema, no cyanosis or clubbing.   Data Reviewed: Basic Metabolic Panel:  Recent Labs Lab 06/05/2015 2327 06/02/15 0315 06/04/15 0527 06/05/15 0544  NA 139 139 145 147*  K 4.1 3.9 3.2* 3.2*  CL 99* 101 104 105  CO2 GLUCOSE 348* 329* 268* 370*  BUN 30*  CREATININE 0.95 0.99 0.91 1.08*  CALCIUM 9.9 9.1 9.2 9.1  MG  --  1.4*  --  2.5*   CBC:  Recent Labs Lab 06/05/2015 2327 06/02/15 0315 06/04/15 0527 06/05/15 0544  WBC 10.4 8.3 10.2 8.2  NEUTROABS 9.2*  --   --   --   HGB 13.8 12.9 11.3* 11.8*  HCT 41.7 40.1 35.0* 37.3  MCV 96.1 97.8 98.3 99.5  PLT 195 146* 100* 97*   Cardiac Enzymes:  Recent Labs Lab 06/03/2015 2327  TROPONINI <0.03   BNP (last 3 results)  Recent Labs  05/25/2015 2327  BNP 72.0   CBG:  Recent Labs Lab 06/02/15 0228  GLUCAP 296*    Recent Results (from the past 240 hour(s))  Blood culture (routine x 2)     Status: None (Preliminary result)   Collection Time: 06/02/15  1:20 AM  Result Value Ref Range Status   Specimen Description BLOOD RIGHT ANTECUBITAL  Final   Special Requests BOTTLES  DRAWN AEROBIC AND ANAEROBIC 5CC   Final   Culture NO GROWTH 2 DAYS  Final   Report Status PENDING  Incomplete  Blood culture (routine x 2)     Status: None (Preliminary result)   Collection Time: 06/02/15  1:22 AM  Result Value Ref Range Status   Specimen Description BLOOD LEFT HAND  Final   Special Requests BOTTLES DRAWN AEROBIC ONLY 3CC  Final   Culture NO GROWTH 2 DAYS  Final   Report Status PENDING  Incomplete  MRSA PCR Screening     Status: Abnormal   Collection Time: 06/02/15 12:45 PM  Result Value Ref Range Status   MRSA by PCR POSITIVE (A) NEGATIVE Final    Comment:        The GeneXpert MRSA Assay (FDA approved for NASAL specimens only), is one component of a comprehensive MRSA colonization surveillance program. It is not intended to diagnose MRSA infection nor to guide or monitor treatment for MRSA infections. RESULT CALLED TO, READ BACK BY AND VERIFIED WITH: ADAMS,J RN 06/02/15 1835 WOOTEN,K      Studies: No results found.  Scheduled Meds: . antiseptic oral rinse  7 mL Mouth Rinse q12n4p  . budesonide (  PULMICORT) nebulizer solution  0.25 mg Nebulization BID  . chlorhexidine  15 mL Mouth Rinse BID  . Chlorhexidine Gluconate Cloth  6 each Topical Q0600  . divalproex  125 mg Oral 3 times per day  . docusate sodium  100 mg Oral BID  . escitalopram  20 mg Oral q morning - 10a  . furosemide  20 mg Intravenous Q12H  . furosemide  40 mg Intravenous Once  . haloperidol  5 mg Oral QPM  . ipratropium-albuterol  3 mL Nebulization TID  . memantine  10 mg Oral BID  . methylPREDNISolone (SOLU-MEDROL) injection  40 mg Intravenous Q12H  . mupirocin ointment  1 application Nasal BID  . potassium chloride  20 mEq Oral Daily  . raloxifene  60 mg Oral q morning - 10a  . senna-docusate  1 tablet Oral QHS   Continuous Infusions:   Active Problems:   Acute respiratory failure with hypoxia   Abdominal pain, left lower quadrant   Leukocytosis   Lactic acid acidosis   Advanced  dementia   Dyspnea   Pressure ulcer    Time spent: 30 minutes    Ines Rebel A  [facility] Pager [phone]. If 7PM-7AM, please contact night-coverage at [email or web address], password [facility] 06/05/2015, 12:08 PM  LOS: 3 days

## 2015-06-05 NOTE — Progress Notes (Signed)
Late entry:  BP 177/86.  Notified on-call NP.  No new orders at this time.

## 2015-06-06 DIAGNOSIS — R0902 Hypoxemia: Secondary | ICD-10-CM

## 2015-06-07 LAB — CULTURE, BLOOD (ROUTINE X 2)
CULTURE: NO GROWTH
Culture: NO GROWTH

## 2015-06-20 NOTE — Discharge Summary (Signed)
Death Summary  Hannah Spears ZOX:[id number] DOB: Apr 01, 1920 DOA: June 24, 2015  PCP: Minda Meo, MD   Admit date: 06-24-2015 Date of Death: 06-29-2015  Final Diagnoses:    Acute respiratory failure with hypoxia   PNA   Acute Diastolic Heart Failure   Abdominal pain, left lower quadrant   Leukocytosis   Lactic acid acidosis   Advanced dementia   Dyspnea   Pressure ulcer   Hypoxemia   SOB (shortness of breath)   Palliative care encounter   History of present illness:  Hannah Spears is a 81 y.o. female with past medical history of hypertension who is brought by EMS.  The patient cannot provide a history, most of the history is obtained from the chart and from daughter, she relates she was called this afternoon about the patient being short of breath. she was given IM Lasix as she was satting 86% on room air and she was transferred to the emergency room. Family states she has not been behaving as she normally does. the patient  was short of breath, she's been complaining of body aches with abdominal pain and back pain. The relates no fever, cough, diarrhea. The patient using it multiple laxatives and stool softeners to have bowel movements, she uses them on a daily basis.  Hospital Course:  Patient was admitted with Hypoxic Respiratory Failure, found to have PNA. She was treated with Antibiotics and also IV lasix for Heart failure. She received nebulizer treatments. Despite treatments patient respiratory failure continue to decline. Palliative was consulted. Daughter agree with comfort and IV morphine for respiratory distress and to continue with IV antibiotics. Patient died at 10;00 am on 29-Jun-2015.   Assessment/Plan: 1-Acute respiratory failure with hypoxia: Appears to be secondary to interstitial edema and community-acquired pneumonia -Patient will be treated with IV Lasix, antibiotics, nebulizer therapy, mucolytic's and oxygen supplementation. On steroids for wheezing and presumed  mild COPD exacerbation -worsening hypoxemia, on NM mask and encephalopathy.  -discussed with daughter, poor prognosis. Daughter agree with comfort measure.  -IV morphine for increase work of breathing.  -no further labs drawn.  -Palliative care consulted, appreciated palliative team help/    2-Encephalopathy; multifactorial, hypoxemia, infectious process,   3-History of COPD with mild exacerbation: Continue nebulizer therapy and the use of Pulmicort -nebulizer , solumedrol.   4-Dementia with behavioral disturbances: -Continue supportive care -Continue Depakote, Lexapro and as needed Ativan  -Hold Namenda   5-Constipation with stool port seen on CT abdomen/pelvis  -Will continue miralax and Senokot  -When necessary enema has been order as needed if bowel movement not achieved  -had multiples bowel movements.   6-Hypokalemia; replace IV.   7-Acute on chronic diastolic heart failure: EF preserved and no wall motion abnormalities  -Continue Lasix . -2-D echo with preserved Ef; grade 1 diastolic heart failure -continue Low sodium diet and daily weights -Strict intake and output   8-Pressure ulcer: At the most stage I -present since admission -will use barrier creams and prevention measurements (constant reposition)    Time: of death 10;10  Signed:  Joany Khatib A  [facility] June 29, 2015, 10:28 AM

## 2015-06-20 NOTE — Progress Notes (Signed)
Pt was picked up by [employer]. Pt's daughter removed all pt's belongings from room.

## 2015-06-20 NOTE — Progress Notes (Signed)
Patient is lethargic and unresponsive. RN is unable to give patient medication. Patient on NRB with SpO2 at 86%. RN will continue to monitor.  Veatrice Kells, RN

## 2015-06-20 NOTE — Progress Notes (Signed)
07/05/2015 10:18 AM  Called to patient's room by family members as I was walking down the hall to check on the patient since her pulse oximeter was alarming.  Upon entering the room and assessing the patient, patient was unresponsive, not breathing, no pulse palpable.  Auscultation revealed no heart sounds.  Carrie Mew, RN, performed second assessment. Time of death 1010.  Dr. Sunnie Nielsen notified.  Family at bedside.  Hannah Spears

## 2015-06-20 NOTE — Progress Notes (Signed)
I met with patient's daughter shortly after her mother passed. She reported being very thankful for the care her mother received here in the hospital. She denied any other needs at this time.  Micheline Rough, MD [place] [facility] [phone]

## 2015-06-20 DEATH — deceased

## 2015-09-24 ENCOUNTER — Ambulatory Visit: Payer: Medicare Other | Admitting: Podiatry

## 2017-02-07 IMAGING — CT CT ABD-PELV W/ CM
2 of 6 series · 16 of 46 positions shown, 18 images · IV contrast (Omni 300)
Comparison: None

CLINICAL DATA: Left lower quadrant pain, low back pain

EXAM:
CT ABDOMEN AND PELVIS WITH CONTRAST
TECHNIQUE: Multidetector CT imaging of the abdomen and pelvis was performed
using the standard protocol following bolus administration of
intravenous contrast.
CONTRAST:  80mL OMNIPAQUE IOHEXOL 300 MG/ML  SOLN

[Series 2: abd/ pelvis 5.0 i30f 1 · axial · 0.77mm/px · z∈[-1056,-666]mm · 13 of 88 slices shown, 15 images]
[im 5/88  soft-tissue]
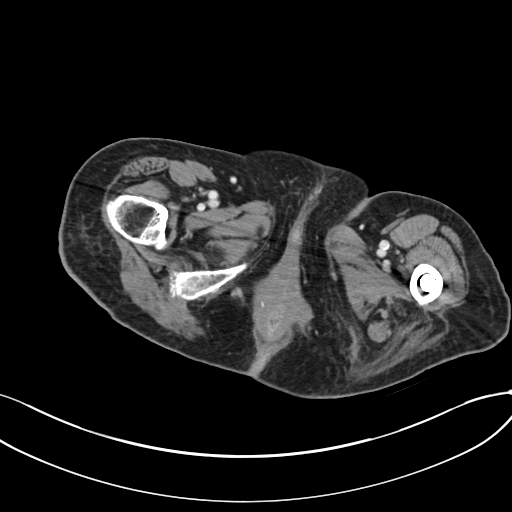
[im 5/88  bone]
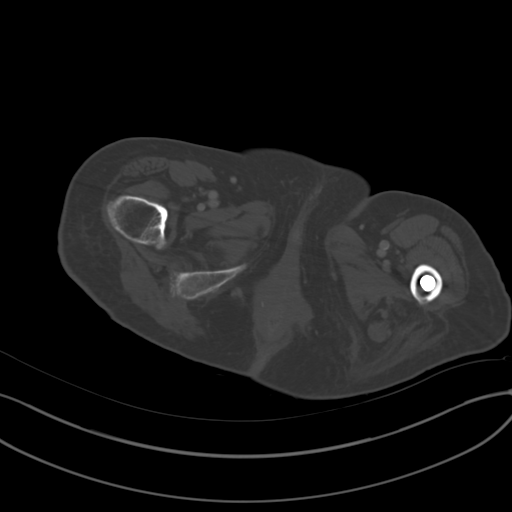
[im 10/88  soft-tissue]
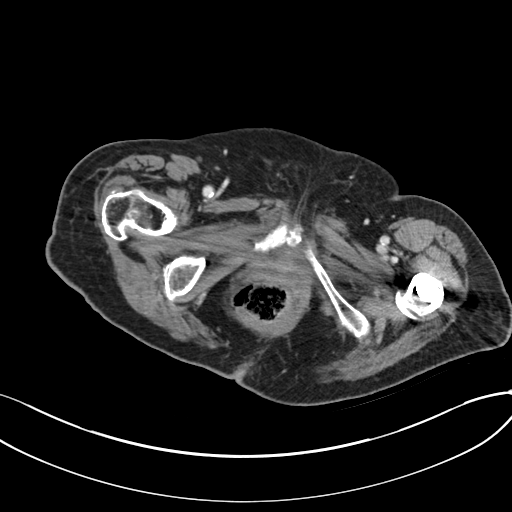
[im 20/88  soft-tissue]
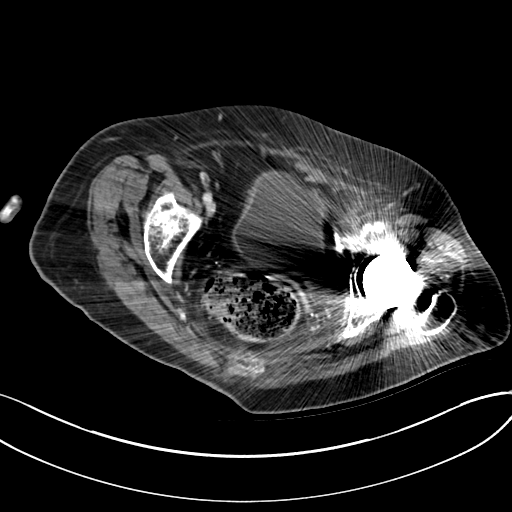
[im 30/88  soft-tissue]
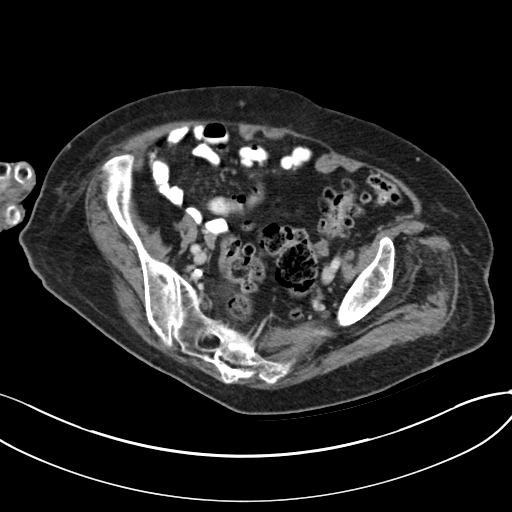
[im 34/88  soft-tissue]
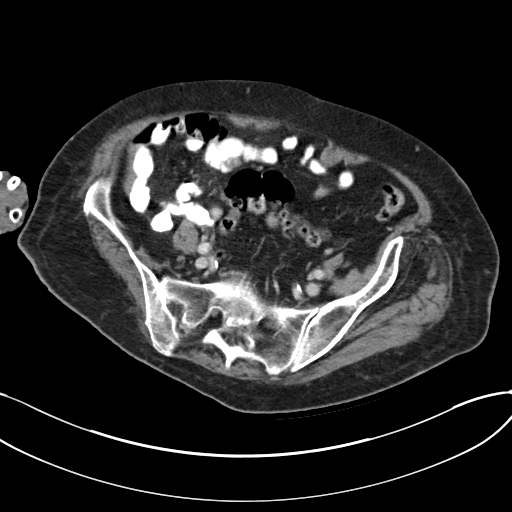
[im 39/88  soft-tissue]
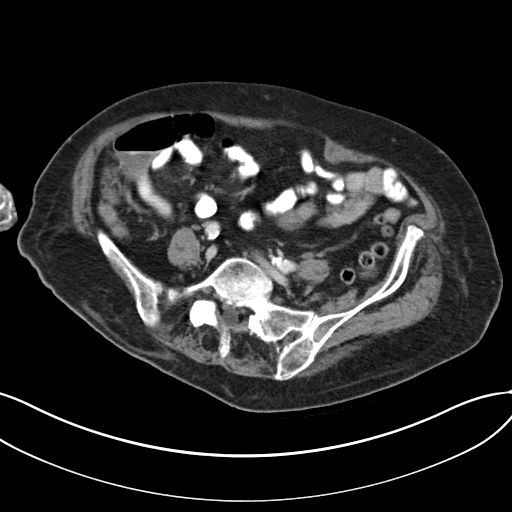
[im 49/88  soft-tissue]
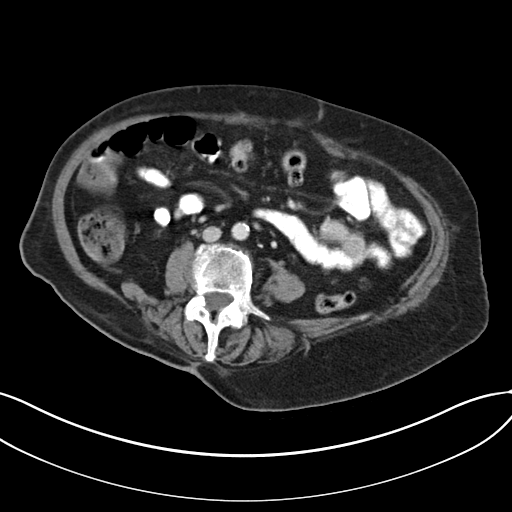
[im 54/88  soft-tissue]
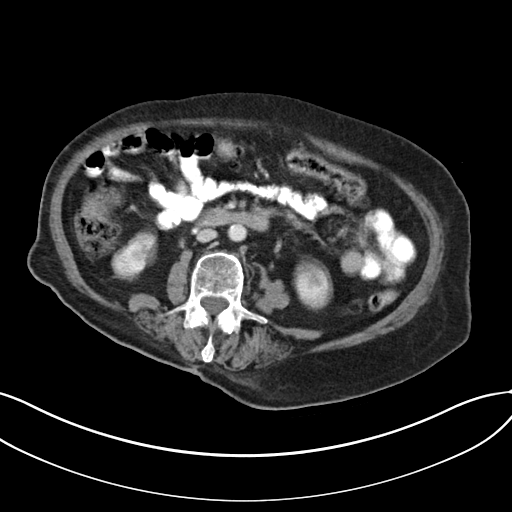
[im 59/88  soft-tissue]
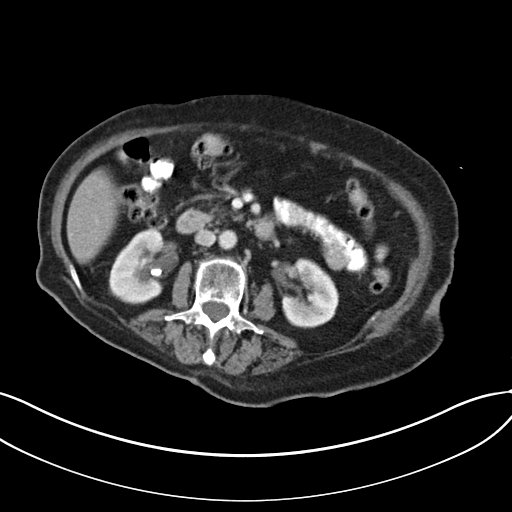
[im 59/88  bone]
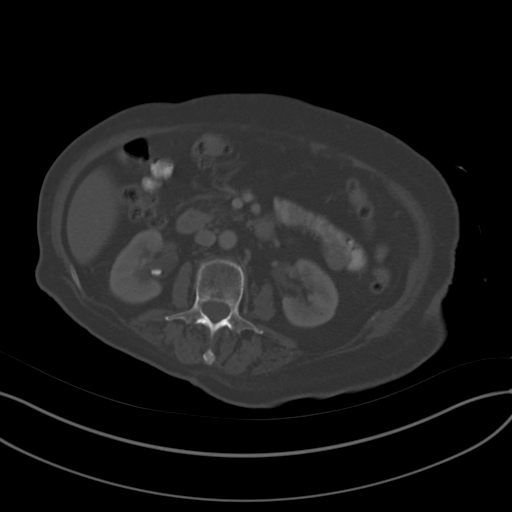
[im 63/88  soft-tissue]
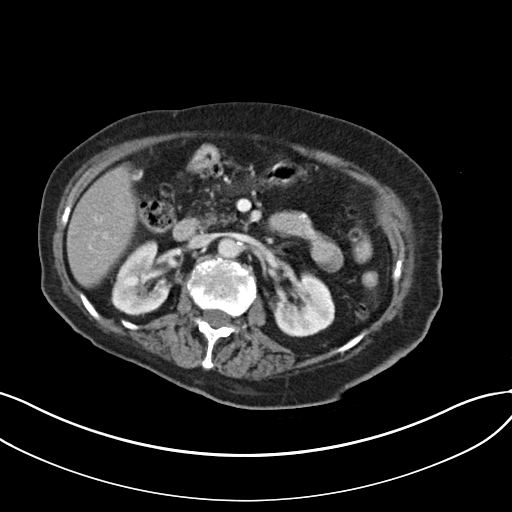
[im 68/88  soft-tissue]
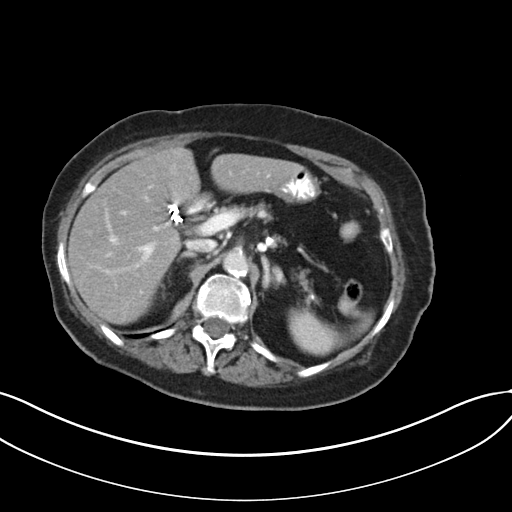
[im 78/88  soft-tissue]
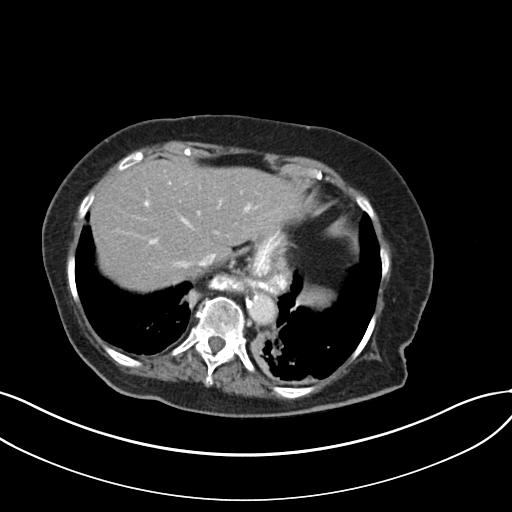
[im 83/88  soft-tissue]
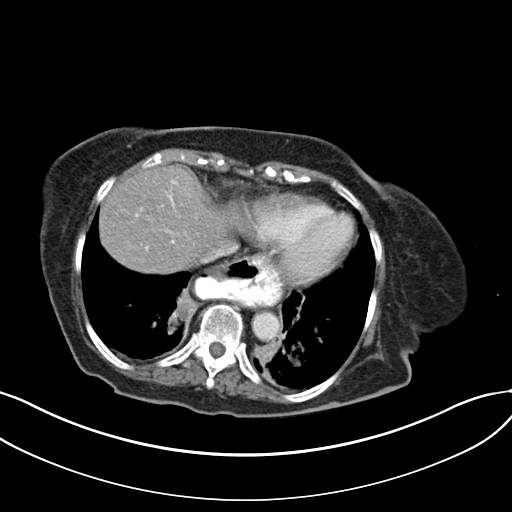

[Series 5: coronals · coronal · 0.78mm/px · 3 of 109 slices shown]
[im 37/109  soft-tissue]
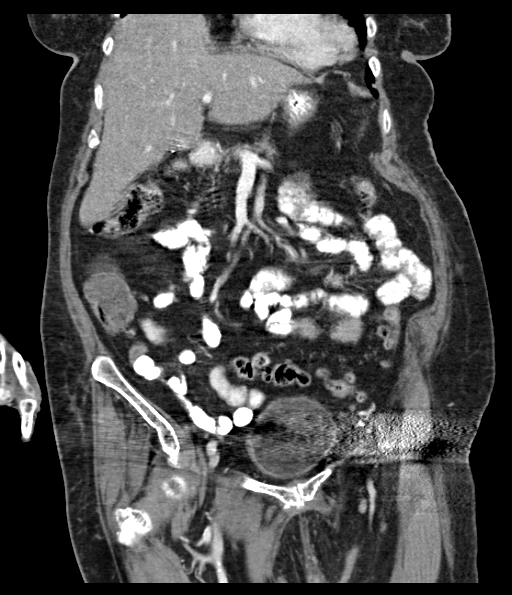
[im 49/109  soft-tissue]
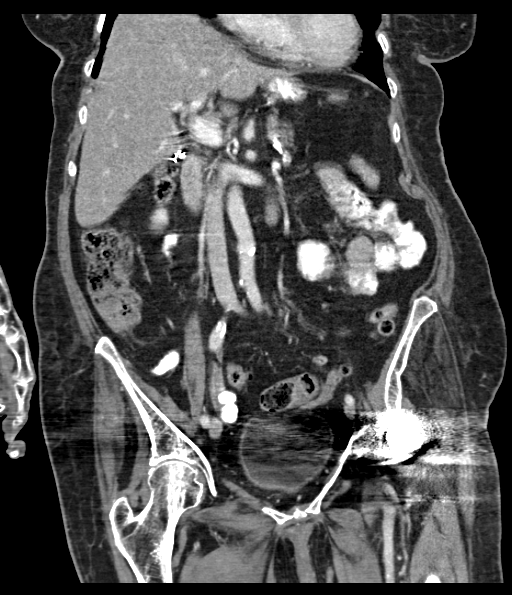
[im 61/109  soft-tissue]
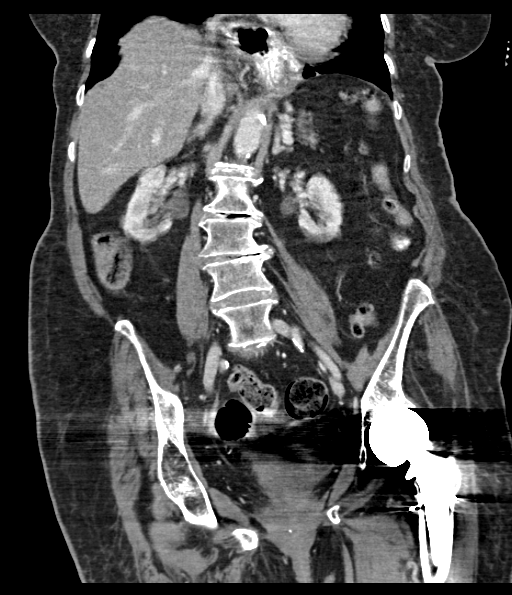

[16 of 46 positions shown; findings below may reference images not displayed]

FINDINGS: Sagittal images of the spine shows diffuse osteopenia. Degenerative
changes thoracolumbar spine. There is patchy atelectasis or
infiltrate bilateral lower lobe posteriorly. Fatty infiltration of
the liver is noted. Status postcholecystectomy. Large hiatal hernia
measures 7.4 by 4.4 cm. Enhanced pancreas, spleen and adrenal glands
are unremarkable. The kidneys show symmetrical enhancement. There is
9 mm calcified calculus in right renal pelvis nonobstructive. No
hydronephrosis or hydroureter. No calcified ureteral calculi.

No small bowel obstruction. No ascites or free air. No adenopathy.
Moderate stool noted in right colon. Multiple colonic diverticula
are noted transverse colon. Multiple colonic diverticula descending
colon and sigmoid colon. There is no evidence of acute
diverticulitis. Moderate stool noted in distal sigmoid colon.
Evaluation of the pelvis is limited by metallic artifacts from left
hip prosthesis. There is abundant stool within rectum measures
cm in diameter highly suspicious for fecal impaction. Urinary
bladder is unremarkable. Degenerative changes pubic symphysis. There
is thickening of anal wall. Clinical correlation is necessary. No
inguinal adenopathy.

The patient is status post hysterectomy. No pericecal inflammation.
The terminal ileum is unremarkable.
IMPRESSION: 1. Fatty infiltration of the liver is noted.
2. Large hiatal hernia.
3. Status postcholecystectomy.
4. Trace right nonobstructive nephrolithiasis. No hydronephrosis or
hydroureter.
5. No small bowel obstruction.
6. Patchy atelectasis or infiltrate bilateral lower lobe
posteriorly.
7. Multiple colonic diverticula. No definite evidence of acute
diverticulitis.
8. Moderate stool noted in distal sigmoid colon. Abundant stool
noted within distended rectum. Highly suspicious for distal fecal
impaction. Limited evaluation of the pelvis from artifacts from left
hip prosthesis.

## 2017-02-10 IMAGING — CR DG CHEST 1V PORT
1 series · 1 of 1 positions shown · non-contrast
Comparison: 06/03/2015

CLINICAL DATA: Hypoxemia.  Hypertension, diabetes, dementia, COPD.

EXAM:
PORTABLE CHEST - 1 VIEW

[AP]
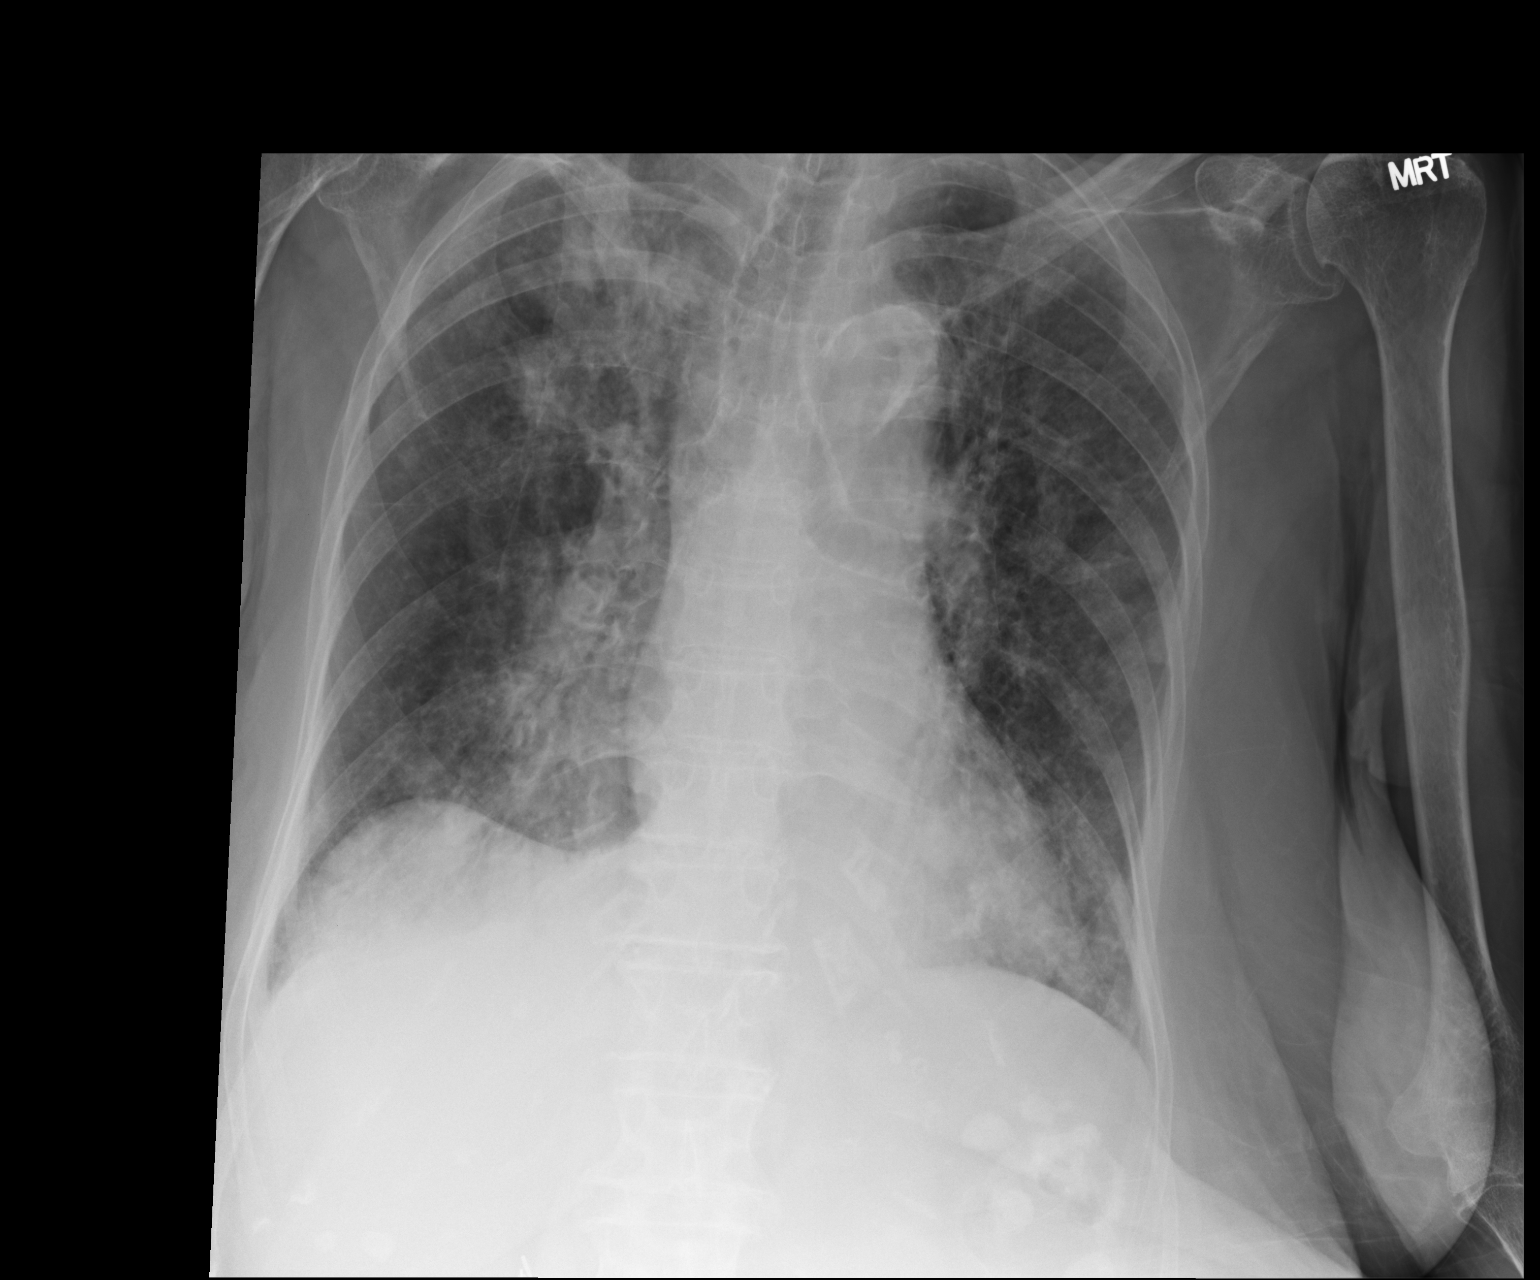

[1 of 1 positions shown; findings below may reference images not displayed]

FINDINGS: Heart and mediastinal contours are within normal limits. Densely
calcified aortic arch. Bilateral patchy airspace opacities are
similar to prior study concerning for multifocal pneumonia. No
effusions. No acute bony abnormality.
IMPRESSION: Stable patchy bilateral airspace opacities concerning for multifocal
pneumonia. No real change.
